# Patient Record
Sex: Female | Born: 1960
Health system: Southern US, Community
[De-identification: ages and names within clinical notes are randomized; demographics above are authoritative.]

## PROBLEM LIST (undated history)

## (undated) DIAGNOSIS — S62629A Displaced fracture of medial phalanx of unspecified finger, initial encounter for closed fracture: Secondary | ICD-10-CM

## (undated) HISTORY — PX: FINGER SURGERY: SHX640

## (undated) HISTORY — DX: Displaced fracture of middle phalanx of unspecified finger, initial encounter for closed fracture: S62.629A

---

## 2006-12-02 ENCOUNTER — Emergency Department (HOSPITAL_COMMUNITY): Admission: EM | Admit: 2006-12-02 | Discharge: 2006-12-02 | Payer: Self-pay | Admitting: Emergency Medicine

## 2006-12-03 ENCOUNTER — Ambulatory Visit: Payer: Self-pay | Admitting: Vascular Surgery

## 2006-12-03 ENCOUNTER — Ambulatory Visit (HOSPITAL_COMMUNITY): Admission: RE | Admit: 2006-12-03 | Discharge: 2006-12-03 | Payer: Self-pay | Admitting: Emergency Medicine

## 2006-12-04 ENCOUNTER — Ambulatory Visit: Payer: Self-pay | Admitting: Family Medicine

## 2006-12-18 ENCOUNTER — Ambulatory Visit: Payer: Self-pay | Admitting: Vascular Surgery

## 2007-03-14 ENCOUNTER — Encounter: Payer: Self-pay | Admitting: Family Medicine

## 2007-05-06 ENCOUNTER — Ambulatory Visit: Payer: Self-pay | Admitting: Family Medicine

## 2009-08-11 ENCOUNTER — Ambulatory Visit: Payer: Self-pay | Admitting: Family Medicine

## 2009-08-11 ENCOUNTER — Other Ambulatory Visit: Admission: RE | Admit: 2009-08-11 | Discharge: 2009-08-11 | Payer: Self-pay | Admitting: Family Medicine

## 2009-08-11 DIAGNOSIS — R109 Unspecified abdominal pain: Secondary | ICD-10-CM | POA: Insufficient documentation

## 2009-08-11 DIAGNOSIS — F17200 Nicotine dependence, unspecified, uncomplicated: Secondary | ICD-10-CM | POA: Insufficient documentation

## 2009-08-11 DIAGNOSIS — K5909 Other constipation: Secondary | ICD-10-CM | POA: Insufficient documentation

## 2009-08-11 DIAGNOSIS — N760 Acute vaginitis: Secondary | ICD-10-CM | POA: Insufficient documentation

## 2009-08-12 ENCOUNTER — Encounter: Payer: Self-pay | Admitting: Physician Assistant

## 2009-08-12 LAB — CONVERTED CEMR LAB: Gardnerella vaginalis: POSITIVE — AB

## 2009-08-16 ENCOUNTER — Encounter: Payer: Self-pay | Admitting: Physician Assistant

## 2009-08-17 ENCOUNTER — Encounter: Payer: Self-pay | Admitting: Physician Assistant

## 2009-08-17 LAB — CONVERTED CEMR LAB
ALT: 15 units/L (ref 0–35)
AST: 20 units/L (ref 0–37)
Albumin: 4.4 g/dL (ref 3.5–5.2)
Alkaline Phosphatase: 47 units/L (ref 39–117)
CO2: 24 meq/L (ref 19–32)
Calcium: 9.7 mg/dL (ref 8.4–10.5)
HCT: 33 % — ABNORMAL LOW (ref 36.0–46.0)
HDL: 60 mg/dL (ref >39)
Hemoglobin: 11.6 g/dL — ABNORMAL LOW (ref 12.0–15.0)
LDL Cholesterol: 108 mg/dL — ABNORMAL HIGH (ref 0–99)
Potassium: 4.3 meq/L (ref 3.5–5.3)
RDW: 13.3 % (ref 11.5–15.5)
Retic Ct Pct: 0.9 % (ref 0.4–3.1)
TSH: 0.48 microintl units/mL (ref 0.350–4.500)
Total Protein: 6.7 g/dL (ref 6.0–8.3)
Triglycerides: 86 mg/dL (ref <150)
Vit D, 25-Hydroxy: 18 ng/mL — ABNORMAL LOW (ref 30–89)
WBC: 3.9 10*3/uL — ABNORMAL LOW (ref 4.0–10.5)

## 2009-08-18 ENCOUNTER — Encounter: Payer: Self-pay | Admitting: Physician Assistant

## 2009-08-18 ENCOUNTER — Ambulatory Visit: Payer: Self-pay | Admitting: Family Medicine

## 2009-08-18 LAB — CONVERTED CEMR LAB: Chlamydia, Swab/Urine, PCR: NEGATIVE

## 2009-08-19 ENCOUNTER — Telehealth: Payer: Self-pay | Admitting: Family Medicine

## 2009-08-20 ENCOUNTER — Encounter: Payer: Self-pay | Admitting: Family Medicine

## 2009-08-25 ENCOUNTER — Telehealth: Payer: Self-pay | Admitting: Family Medicine

## 2009-09-01 ENCOUNTER — Ambulatory Visit: Payer: Self-pay | Admitting: Family Medicine

## 2009-09-01 DIAGNOSIS — A5901 Trichomonal vulvovaginitis: Secondary | ICD-10-CM | POA: Insufficient documentation

## 2009-09-02 ENCOUNTER — Telehealth: Payer: Self-pay | Admitting: Physician Assistant

## 2009-09-02 ENCOUNTER — Encounter: Payer: Self-pay | Admitting: Physician Assistant

## 2009-09-02 LAB — CONVERTED CEMR LAB: Candida species: NEGATIVE

## 2009-12-31 ENCOUNTER — Ambulatory Visit: Payer: Self-pay | Admitting: Family Medicine

## 2009-12-31 DIAGNOSIS — M7552 Bursitis of left shoulder: Secondary | ICD-10-CM | POA: Insufficient documentation

## 2010-04-03 ENCOUNTER — Encounter: Payer: Self-pay | Admitting: Family Medicine

## 2010-04-12 NOTE — Letter (Signed)
Summary: consults  consults   Imported By: Curtis Sites 10/07/2009 13:15:30  _____________________________________________________________________  External Attachment:    Type:   Image     Comment:   External Document

## 2010-04-12 NOTE — Letter (Signed)
Summary: progress notes  progress notes   Imported By: Curtis Sites 10/07/2009 13:20:29  _____________________________________________________________________  External Attachment:    Type:   Image     Comment:   External Document

## 2010-04-12 NOTE — Letter (Signed)
Summary: phone notes  phone notes   Imported By: Curtis Sites 10/07/2009 13:19:56  _____________________________________________________________________  External Attachment:    Type:   Image     Comment:   External Document

## 2010-04-12 NOTE — Letter (Signed)
Summary: lab  lab   Imported By: Curtis Sites 10/07/2009 13:16:44  _____________________________________________________________________  External Attachment:    Type:   Image     Comment:   External Document

## 2010-04-12 NOTE — Letter (Signed)
Summary: GYNECCOLOGIC CYTOLOGY  GYNECCOLOGIC CYTOLOGY   Imported By: Lind Guest 09/08/2009 14:25:41  _____________________________________________________________________  External Attachment:    Type:   Image     Comment:   External Document

## 2010-04-12 NOTE — Assessment & Plan Note (Signed)
Summary: physical- room 3   Vital Signs:  Patient profile:   50 year old female Weight:      171.50 pounds O2 Sat:      100 % on Room air Pulse rate:   85 / minute Resp:     16 per minute BP sitting:   104 / 68  (left arm)  Vitals Entered By: Adella Hare LPN (August 12, 5619 9:28 AM) Marland Kitchen  CC: physical Is Patient Diabetic? No Pain Assessment Patient in pain? no        CC:  physical.  History of Present Illness: Pt is here today for her annual PE/Pap.  Menses usually once a mos.  Had menses May 10-14th, nl time/expected.  Then after 1 week had another menses.  This is unusual for her. Using condoms for birth control.  Little vag dischg with odor. No new partners. +SBE Last mamm approx 2009. +Dentist + Eye Dr, last eye exam May 2011. Tetnus utd.  Hx of constipation.  Uses Dulcolax pills twice a week to have BM x approx 1 yr.   Current Medications (verified): 1)  None  Allergies (verified): No Known Drug Allergies  Past History:  Past medical, surgical, family and social histories (including risk factors) reviewed for relevance to current acute and chronic problems.  Past Medical History: Unremarkable  Past Surgical History: Right hand surgery childhood  Family History: Reviewed history and no changes required. mother living- DM father living- HTN Two sisters living- presumed healthy Two brothers deceased- mva, sids  Social History: Reviewed history and no changes required. Employed fulltime [employer]or, [employer] Divorced One grown child Current Smoker a half pack a day Alcohol use- socially Drug use-no Regular exercise-yes Smoking Status:  current Drug Use:  no Does Patient Exercise:  yes  Review of Systems General:  Denies chills and fever. ENT:  Denies earache, nasal congestion, and sore throat. CV:  Denies chest pain or discomfort and palpitations. Resp:  Denies cough and shortness of breath. GI:  Complains of constipation; denies  indigestion, nausea, and vomiting. GU:  Complains of abnormal vaginal bleeding and discharge; denies dysuria, incontinence, and urinary frequency. Neuro:  Denies headaches, numbness, and tingling. Psych:  Denies anxiety and depression. Allergy:  Denies seasonal allergies and sneezing.  Physical Exam  General:  Well-developed,well-nourished,in no acute distress; alert,appropriate and cooperative throughout examination Head:  Normocephalic and atraumatic without obvious abnormalities. No apparent alopecia or balding. Eyes:  pupils equal, pupils round, pupils reactive to light, and no injection.   Ears:  External ear exam shows no significant lesions or deformities.  Otoscopic examination reveals clear canals, tympanic membranes are intact bilaterally without bulging, retraction, inflammation or discharge. Hearing is grossly normal bilaterally. Nose:  External nasal examination shows no deformity or inflammation. Nasal mucosa are pink and moist without lesions or exudates. Mouth:  Oral mucosa and oropharynx without lesions or exudates.  Teeth in good repair. Neck:  No deformities, masses, or tenderness noted.no thyromegaly.   Chest Wall:  no deformities and no mass.   Breasts:  No mass, nodules, thickening, tenderness, bulging, retraction, inflamation, nipple discharge or skin changes noted.   Lungs:  Normal respiratory effort, chest expands symmetrically. Lungs are clear to auscultation, no crackles or wheezes. Heart:  Normal rate and regular rhythm. S1 and S2 normal without gallop, murmur, click, rub or other extra sounds. Abdomen:  Bowel sounds positive,abdomen soft and non-tender without masses, organomegaly or hernias noted. Rectal:  No external abnormalities noted. Normal sphincter tone.  No rectal masses or tenderness. Genitalia:  Normal introitus for age, no external lesions, no vaginal discharge, mucosa pink and moist, no vaginal or cervical lesions, no vaginal atrophy, no friaility or  hemorrhage, normal uterus size and position, no adnexal masses .  Mild tenderness bilat adnexa.  No CMT. Neurologic:  alert & oriented X3, sensation intact to light touch, and gait normal.   Skin:  Intact without suspicious lesions or rashes Cervical Nodes:  No lymphadenopathy noted Psych:  Cognition and judgment appear intact. Alert and cooperative with normal attention span and concentration. No apparent delusions, illusions, hallucinations   Impression & Recommendations:  Problem # 1:  Preventive Health Care (ICD-V70.0) Discussed this months abn vag bleeding.  Advised pt that will need to wait & monitor.  If this occurs again may be due to perimenopause.  Problem # 2:  CONSTIPATION, CHRONIC (ICD-564.09) Assessment: New Discussed chronic laxative use.    Orders: Gastroenterology Referral (GI) T-Comprehensive Metabolic Panel [phone]) T-TSH [phone])  Her updated medication list for this problem includes:    Miralax Powd (Polyethylene glycol 3350) ..... Use 17 grams in 4-8 ounces of fluid once daily  Problem # 3:  VAGINITIS, BACTERIAL (ICD-616.10) Assessment: New  Her updated medication list for this problem includes:    Metronidazole 500 Mg Tabs (Metronidazole) .Marland Kitchen... Take 1 two times a day for 7 days  Orders: T-Wet Prep by [facility] [phone])  Problem # 4:  NICOTINE ADDICTION (ICD-305.1) Assessment: Comment Only  Encouraged smoking cessation and discussed different methods for smoking cessation.  Pt declines prescription.   Complete Medication List: 1)  Metronidazole 500 Mg Tabs (Metronidazole) .... Take 1 two times a day for 7 days 2)  Miralax Powd (Polyethylene glycol 3350) .... Use 17 grams in 4-8 ounces of fluid once daily  Other Orders: T-Lipid Profile ([id number]) T-CBC No Diff ([id number]) T-Vitamin D (25-Hydroxy) (02725-36644) Pap Smear (03474) Mammogram (Screening) (Mammo) Pelvic Ultrasound (Sono Pelvis)  Patient Instructions: 1)   Please schedule a follow-up appointment in 2 months. 2)  Tobacco is very bad for your health and your loved ones! You Should stop smoking!. 3)  Stop Smoking Tips: Choose a Quit date. Cut down before the Quit date. decide what you will do as a substitute when you feel the urge to smoke(gum,toothpick,exercise). 4)  I have ordered a mammogram, a pelvic ultrasound and a referral for colonoscopy. 5)  I have prescribed an antibiotic for your vaginal infection. 6)  I have prescribed Miralax for you to use once daily to help with your contstipation.  You should stop using Dulcolax once you have been on the Miralax for 3 days. Prescriptions: MIRALAX  POWD (POLYETHYLENE GLYCOL 3350) use 17 grams in 4-8 ounces of fluid once daily  #1 Lg bottle x 1   Entered and Authorized by:   Esperanza Sheets PA   Signed by:   Esperanza Sheets PA on 08/11/2009   Method used:   Electron[facility] to        [facility]  [address] [address] [id number]       [address]       [place], [place]  [address]       Ph: [phone] or [phone]       Fax: [phone]   RxID:   [id number] METRONIDAZOLE 500 MG TABS (METRONIDAZOLE) take 1 two times a day for 7 days  #14 x 0   Entered and Authorized by:   Alvis Lemmings  Garnette Czech PA   Signed by:   Esperanza Sheets PA on 08/11/2009   Method used:   [facility]        [facility]  [address] [address] [id number]       [address]       [place], [place]  [address]       Ph: [phone] or [phone]       Fax: [phone]   RxID:   [id number]    Immunization History:  Tetanus/Td Immunization History:    Tetanus/Td:  historical (05/06/2007)

## 2010-04-12 NOTE — Assessment & Plan Note (Signed)
Summary: pelvic exam- room 2   Vital Signs:  Patient profile:   50 year old female Height:      67 inches Weight:      170.50 pounds BMI:     26.80 O2 Sat:      100 % on Room air Pulse rate:   74 / minute Resp:     16 per minute BP sitting:   98 / 58  (left arm)  Vitals Entered By: Adella Hare LPN (September 01, 2009 11:12 AM) CC: follow up Is Patient Diabetic? No Pain Assessment Patient in pain? no      Comments did not bring meds to ov   CC:  follow up.  History of Present Illness: Pt presents today for f/u vag cxs for her recent trichamonas infection.  She took her antibiotics as prescribed.  Her partner has gone to a health clinic in [place] but has not recd antibiotic prescription yet to she has not been sexually active with him.  She is aware that she should not be sexually active with him again until he receives and completes treatment.  Pt states her vag dischg has resolved.  No itching or irritation.  At pts request again reviewed her recent lab, mamm and pelvic ultrasound results.  She needs to have blood drawn for the additional STD testing that she had requested due to insufficient sample at the lab and will do so today.  Her pap results are also not available yet.  Allergies (verified): No Known Drug Allergies  Review of Systems GU:  Denies discharge, dysuria, and urinary frequency.  Physical Exam  General:  Well-developed,well-nourished,in no acute distress; alert,appropriate and cooperative throughout examination Head:  Normocephalic and atraumatic without obvious abnormalities. No apparent alopecia or balding. Genitalia:  Normal introitus for age, no external lesions, no vaginal discharge, mucosa pink and moist, no vaginal or cervical lesions, no vaginal atrophy, no friaility or hemorrhage, normal uterus size and position, no adnexal masses or tenderness Skin:  Intact without suspicious lesions or rashes Psych:  Cognition and judgment appear intact. Alert and  cooperative with normal attention span and concentration. No apparent delusions, illusions, hallucinations   Impression & Recommendations:  Problem # 1:  TRICHOMONAL VAGINITIS (ICD-131.01) Assessment Improved Repeat cx done.   All of pts questions answered today regarding her recent infection. Pt was given copies of her lab orders for HIV, RPR and HSV to have drawn today.  Complete Medication List: 1)  Miralax Powd (Polyethylene glycol 3350) .... Use 17 grams in 4-8 ounces of fluid once daily 2)  Vitamin D (ergocalciferol) 50000 Unit Caps (Ergocalciferol) .... One capsule once weekly  Other Orders: HSV 2- [facility] ([id number])  Patient Instructions: 1)  Keep your next appt. 2)  I will notify you of your test results as soon as available. 3)  Call if you have any other questions. 4)  Hope you have a great summer!  Appended Document: pelvic exam- room 2

## 2010-04-12 NOTE — Letter (Signed)
Summary: history and physical  history and physical   Imported By: Curtis Sites 10/07/2009 13:16:06  _____________________________________________________________________  External Attachment:    Type:   Image     Comment:   External Document

## 2010-04-12 NOTE — Progress Notes (Signed)
Summary: results  Phone Note Call from Patient   Summary of Call: pt would like to get results of test. [phone]  pt also wants to know if she needs more labs Initial call taken by: Rudene Anda,  August 25, 2009 3:35 PM  Follow-up for Phone Call        returned call, left message Follow-up by: Adella Hare LPN,  August 25, 2009 3:37 PM  Additional Follow-up for Phone Call Additional follow up Details #1::        patient aware Additional Follow-up by: Adella Hare LPN,  August 25, 2009 3:41 PM

## 2010-04-12 NOTE — Assessment & Plan Note (Signed)
Summary: OV   Vital Signs:  Patient profile:   50 year old female Height:      67 inches Weight:      182 pounds BMI:     28.61 O2 Sat:      97 % on Room air Pulse rate:   70 / minute Pulse rhythm:   regular Resp:     16 per minute BP sitting:   120 / 80  (left arm)  Vitals Entered By: Adella Hare LPN (December 31, 2009 8:39 AM)  Nutrition Counseling: Patient's BMI is greater than 25 and therefore counseled on weight management options.  O2 Flow:  Room air CC: right upper arm pain x 4days Is Patient Diabetic? No Pain Assessment Patient in pain? no        CC:  right upper arm pain x 4days.  History of Present Illness: 4 day h/o right upper arm pain, pt had been mowing with a push mower as well as digging with a hoe. Following this , her symptoms started. She still smokes half packdaily and isstill not commited to quitting, but open to discussion. Reports  thatshe has otherwise been doing well. Denies recent fever or chills. Denies sinus pressure, nasal congestion , ear pain or sore throat. Denies chest congestion, or cough productive of sputum. Denies chest pain, palpitations, PND, orthopnea or leg swelling. Denies abdominal pain, nausea, vomitting, diarrhea or constipation. Denies change in bowel movements or bloody stool. Denies dysuria , frequency, incontinence or hesitancy. Denies  joint pain, swelling, or reduced mobility. Denies headaches, vertigo, seizures. Denies depression, anxiety or insomnia. Denies  rash, lesions, or itch.      Current Medications (verified): 1)  Vitamin D (Ergocalciferol) 50000 Unit Caps (Ergocalciferol) .... One Capsule Once Weekly  Allergies (verified): No Known Drug Allergies  Review of Systems      See HPI Eyes:  Denies blurring and discharge. MS:  Complains of joint pain and stiffness; right shoulder pain with reduced mobility for 5 days. Psych:  Denies anxiety and depression. Endo:  Denies excessive thirst and heat  intolerance. Heme:  Denies abnormal bruising and bleeding. Allergy:  Denies hives or rash and itching eyes.  Physical Exam  General:  Well-developed,well-nourished,in no acute distress; alert,appropriate and cooperative throughout examination HEENT: No facial asymmetry,  EOMI, No sinus tenderness, TM's Clear, oropharynx  pink and moist.   Chest: Clear to auscultation bilaterally.  CVS: S1, S2, No murmurs, No S3.   Abd: Soft, Nontender.  MS: Adequate ROM spine, hips, and knees. Decreased rOM rightshoulder with swelling and tenderness Ext: No edema.   CNS: CN 2-12 intact, power tone and sensation normal throughout.   Skin: Intact, no visible lesions or rashes.  Psych: Good eye contact, normal affect.  Memory intact, not anxious or depressed appearing.    Impression & Recommendations:  Problem # 1:  BURSITIS, RIGHT SHOULDER (ICD-726.10) Assessment Comment Only  Orders: Depo- Medrol 80mg  (J1040) Ketorolac-Toradol 15mg  (X5170) Admin of Therapeutic Inj  intramuscular or subcutaneous (01749)  Problem # 2:  NICOTINE ADDICTION (ICD-305.1) Assessment: Unchanged  Encouraged smoking cessation and discussed different methods for smoking cessation.   Problem # 3:  CONSTIPATION, CHRONIC (ICD-564.09) Assessment: Unchanged  The following medications were removed from the medication list:    Miralax Powd (Polyethylene glycol 3350) ..... Use 17 grams in 4-8 ounces of fluid once daily Patient advised to ensure adequate intake of fruit and vegetables daily, at least 3 servings of each, as well as water intake of at  least 48 ounces daily, adregular exercise. OTC stool softeners are helpful for daily use , up to 4 daily eg colace. Fiber intake daily is needed, in the form of Bran, Shredded Wheat or senekot.  Complete Medication List: 1)  Multivitamin  .... One daily 2)  Calcium/vitamin D  .... 1200mg /1000iiu one capsule daily 3)  Ibuprofen 800 Mg Tabs (Ibuprofen) .... Take 1 tablet by mouth  three times a day 4)  Prednisone (pak) 5 Mg Tabs (Prednisone) .... Use as directed 5)  Vitamin D (ergocalciferol) 50000 Unit Caps (Ergocalciferol) .... One capsule once weekly  Other Orders: T-Vitamin D (25-Hydroxy) ([id number])  Patient Instructions: 1)  Please schedule a follow-up appointment in 4 months. 2)  It is important that you exercise regularly at least 30 minutes 5 times a week. If you develop chest pain, have severe difficulty breathing, or feel very tired , stop exercising immediately and seek medical attention. 3)  You need to lose weight. Consider a lower calorie diet and regular exercise.  4)  you are being treated for bursitis of the right shoulder. pLS do not over exert your shoulder like this again. 5)  pLS call back on monday if no better, you will be referred to ortho at that time. 6)  Injection s today in the office, and meds are also sent in Prescriptions: VITAMIN D (ERGOCALCIFEROL) 50000 UNIT CAPS (ERGOCALCIFEROL) one capsule once weekly  #4 x 3   Entered and Authorized by:   Syliva Overman MD   Signed by:   Syliva Overman MD on 12/31/2009   Method used:   [facility]        [facility] [address] [address] [id number]       [address]       [place], [place]  [address]       Ph: [phone] or [phone]       Fax: [phone]   RxID:   [id number] PREDNISONE (PAK) 5 MG TABS (PREDNISONE) Use as directed  #21 x 0   Entered and Authorized by:   Syliva Overman MD   Signed by:   Syliva Overman MD on 12/31/2009   Method used:   [facility]        [facility] [address] [address] [id number]       [address]       [place], [place]  [address]       Ph: [phone] or [phone]       Fax: [phone]   RxID:   [id number] IBUPROFEN 800 MG TABS (IBUPROFEN) Take 1 tablet by mouth three times a day  #21 x 0   Entered and Authorized by:   Syliva Overman MD   Signed by:   Syliva Overman MD on  12/31/2009   Method used:   [facility]        [facility] [address] [id number]       [address]       [place], [place]  [address]       Ph: [phone] or [phone]       Fax: [phone]   RxID:   [id number]    Medication Administration  Injection # 1:    Medication: Depo- Medrol 80mg     Diagnosis: BURSITIS, RIGHT SHOULDER (ICD-726.10)    Route: IM  Site: RUOQ gluteus    Exp Date: 06/12    Lot #: [id number]    Mfr: Pharmacia    Patient tolerated injection without complications    Given by: Adella Hare LPN (December 31, 2009 11:58 AM)  Injection # 2:    Medication: Ketorolac-Toradol 15mg     Diagnosis: BURSITIS, RIGHT SHOULDER (ICD-726.10)    Route: IM    Site: LUOQ gluteus    Exp Date: 01/12/2011    Lot #: [id number]    Mfr: [email or web address]    Comments: toradol 60mg  given    Patient tolerated injection without complications    Given by: Adella Hare LPN (December 31, 2009 11:59 AM)  Orders Added: 1)  Est. Patient Level IV [[address]] 2)  T-Vitamin D (25-Hydroxy) [phone] 3)  Depo- Medrol 80mg  [J1040] 4)  Ketorolac-Toradol 15mg  [J1885] 5)  Admin of Therapeutic Inj  intramuscular or subcutaneous [[address]372]     Medication Administration  Injection # 1:    Medication: Depo- Medrol 80mg     Diagnosis: BURSITIS, RIGHT SHOULDER (ICD-726.10)    Route: IM    Site: RUOQ gluteus    Exp Date: 06/12    Lot #: [id number]    Mfr: Pharmacia    Patient tolerated injection without complications    Given by: Adella Hare LPN (December 31, 2009 11:58 AM)  Injection # 2:    Medication: Ketorolac-Toradol 15mg     Diagnosis: BURSITIS, RIGHT SHOULDER (ICD-726.10)    Route: IM    Site: LUOQ gluteus    Exp Date: 01/12/2011    Lot #: [id number]    Mfr: [email or web address]    Comments: toradol 60mg  given    Patient tolerated injection without complications    Given by: Adella Hare LPN (December 31, 2009 11:59 AM)  Orders Added: 1)  Est. Patient Level IV [69629] 2)   T-Vitamin D (25-Hydroxy) [52841-[address]40] 3)  Depo- Medrol 80mg  [J1040] 4)  Ketorolac-Toradol 15mg  [J1885] 5)  Admin of Therapeutic Inj  intramuscular or subcutaneous [[address]272]

## 2010-04-12 NOTE — Progress Notes (Signed)
Summary: lab  Phone Note Call from Patient   Summary of Call: lab called and wants you to call about a lab order   call back at [phone] opt. 2  [id number] Initial call taken by: Lind Guest,  August 19, 2009 3:52 PM  Follow-up for Phone Call        called, patient already aware she needs restick Follow-up by: Everitt Amber LPN,  August 20, 2009 8:57 AM

## 2010-04-12 NOTE — Letter (Signed)
Summary: misc.  misc.   Imported By: Curtis Sites 10/07/2009 13:19:33  _____________________________________________________________________  External Attachment:    Type:   Image     Comment:   External Document

## 2010-04-12 NOTE — Progress Notes (Signed)
  Phone Note Call from Patient   Caller: Patient Summary of Call: wants to know if you would still be willing to write script for her partner? Initial call taken by: Adella Hare LPN,  September 02, 2009 12:22 PM  Follow-up for Phone Call        He is not a pt of our practice so I cannot prescribe for him.  Sorry. Follow-up by: Esperanza Sheets PA,  September 02, 2009 12:59 PM  Additional Follow-up for Phone Call Additional follow up Details #1::        returned call, left message Additional Follow-up by: Adella Hare LPN,  September 02, 2009 3:40 PM    Additional Follow-up for Phone Call Additional follow up Details #2::    Judithe aware Follow-up by: Adella Hare LPN,  September 02, 2009 3:46 PM

## 2010-04-12 NOTE — Letter (Signed)
Summary: demographic  demographic   Imported By: Curtis Sites 10/07/2009 13:15:47  _____________________________________________________________________  External Attachment:    Type:   Image     Comment:   External Document

## 2010-04-12 NOTE — Assessment & Plan Note (Signed)
Summary: office visit   Vital Signs:  Patient profile:   50 year old female Height:      67 inches Weight:      173 pounds BMI:     27.19 O2 Sat:      97 % Pulse rate:   74 / minute Pulse rhythm:   regular Resp:     16 per minute BP sitting:   122 / 82  (left arm) Cuff size:   large  Vitals Entered By: Everitt Amber LPN (August 18, 1608 11:15 AM)  Nutrition Counseling: Patient's BMI is greater than 25 and therefore counseled on weight management options. CC: Follow up chronic problems, has been itching alot lately   CC:  Follow up chronic problems and has been itching alot lately.  History of Present Illness: Pt in today with primary concern about being recently dx with an STD, she is tearful, upset and slightly confused, this is her first TD, and she recently entered a new re;lationship. She requests testing for all possible STD's . She reports an episode of acute pelvic pain, she has already been referred for a pelvic US which she is encouraged to keep, she seemed to have "forgotten" the appt,but actuall isewxtremely anxious and tearful at the visit.   Preventive Screening-Counseling & Management  Alcohol-Tobacco     Smoking Cessation Counseling: yes  Current Medications (verified): 1)  Miralax  Powd (Polyethylene Glycol 3350) .... Use 17 Grams in 4-8 Ounces of Fluid Once Daily  Allergies (verified): No Known Drug Allergies  Review of Systems      See HPI General:  Denies chills and fever. Eyes:  Denies discharge and red eye. ENT:  Denies hoarseness, nasal congestion, and sinus pressure. CV:  Denies chest pain or discomfort, difficulty breathing while lying down, palpitations, and swelling of feet. Resp:  Denies cough, sputum productive, and wheezing. GI:  Complains of constipation; denies diarrhea, nausea, and vomiting; chronic. GU:  Complains of discharge; denies dysuria and urinary frequency. MS:  Denies joint pain and stiffness. Derm:  Denies itching and  rash. Neuro:  Denies headaches, seizures, and tingling. Psych:  Complains of anxiety and easily tearful; denies suicidal thoughts/plans, thoughts of violence, and unusual visions or sounds; acute anxiety and depression with recent STD dx.. Endo:  Denies excessive thirst and excessive urination. Heme:  Denies abnormal bruising and bleeding. Allergy:  Denies hives or rash and sneezing.  Physical Exam  General:  Well-developed,well-nourished,in no acute distress; alert,appropriate and cooperative throughout examination. tearful, anxious and scared. HEENT: No facial asymmetry,  EOMI, No sinus tenderness, TM's Clear, oropharynx  pink and moist.   Chest: Clear to auscultation bilaterally. Decreased air entry bilaterally.  CVS: S1, S2, No murmurs, No S3.   Abd: Soft, Nontender.  MS: Adequate ROM spine, hips, shoulders and knees.  Ext: No edema.   CNS: CN 2-12 intact, power tone and sensation normal throughout.   Skin: Intact, no visible lesions or rashes.  Psych: Good eye contact, normal affect.  Memory intact.    Impression & Recommendations:  Problem # 1:  VAGINITIS, BACTERIAL (ICD-616.10) Assessment Comment Only safe sex counselling done, the need for treatment of her partener was stressed, she is to have additional SYTD tsting. In house counselling doneextensively, pt distraught with thedx of an STD, her first ever, recently entered a newrelationship, has plans to abstain until restedsted., her decision  Problem # 2:  NICOTINE ADDICTION (ICD-305.1) Assessment: Unchanged  Encouraged smoking cessation and discussed different methods for smoking  cessation.   Problem # 3:  PELVIC PAIN, ACUTE (ICD-789.09) Assessment: Comment Only she is advised of the pelvic US already scheduled and encourage d to keep the appt which I believe she will, she will f/u with her provider at next ov  Complete Medication List: 1)  Miralax Powd (Polyethylene glycol 3350) .... Use 17 grams in 4-8 ounces of  fluid once daily 2)  Vitamin D (ergocalciferol) 50000 Unit Caps (Ergocalciferol) .... One capsule once weekly  Other Orders: T-Chlamydia & GC Probe, Urine ([id number])  Patient Instructions: 1)  F/u in 2 weeks for pelvic exam   with PA 2)  pls start vit D tabs 3)  Pls start MVI with iron and calcium with D one daily of each 4)  Keep appt for pelvic US. 5)  Your partener needs treatment for TRICHOMONAS 6)  Tobacco is very bad for your health and your loved ones! You Should stop smoking!. 7)  Stop Smoking Tips: Choose a Quit date. Cut down before the Quit date. decide what you will do as a substitute when you feel the urge to smoke(gum,toothpick,exercise). Prescriptions: VITAMIN D (ERGOCALCIFEROL) 50000 UNIT CAPS (ERGOCALCIFEROL) one capsule once weekly  #4 x 4   Entered and Authorized by:   Syliva Overman MD   Signed by:   Syliva Overman MD on 08/18/2009   Method used:   Electronically to        [facility]  [address] [address] [id number] (retail)       [address]       [place], [place]  [address]       Ph: [phone] or [phone]       Fax: [phone]   RxID:   [id number]

## 2010-07-26 NOTE — Consult Note (Signed)
VASCULAR SURGERY CONSULTATION   Guerrero, Laura ANN  DOB:  November 22, 1960                                       12/18/2006  ZOXWR#:[id number]   REASON FOR CONSULTATION:  I saw Laura Guerrero in the office today in  consultation concerning her left leg swelling.  She was referred by Dr.  Syliva Overman.   HISTORY OF PRESENT ILLNESS:  This is a pleasant 50 year old woman who  had been having some problems with her knee popping.  She was  evaluated by an orthopedic surgeon and they attempted to aspirate fluid  from the joint but there was really no fluid, she did have a cortisone  injection at that time.  She states that since that time she has noticed  swelling in the left leg and ankle.  She does not remember any specific  injury to the area.  She has had no previous history of surgery or  radiation therapy to the abdomen or groins.  She has had no history of  DVT or phlebitis in the past.   PAST MEDICAL HISTORY:  Her past medical history is unremarkable.  She  denies any history of diabetes, hypertension, hypercholesterolemia,  history of previous myocardial infarction, or history of congestive  heart failure.   FAMILY HISTORY:  There is no history of premature cardiovascular  disease.   SOCIAL HISTORY:  She is single, she has one child.  She smokes a half a  pack per day and has been smoking for 30 years.   REVIEW OF SYSTEMS:  The review of systems is documented on the medical  history form in her chart.  Of note, she is on no medications.   PHYSICAL EXAMINATION:  On physical examination blood pressure is 110/69,  heart rate is 69.  I did not detect any carotid bruits.  The lungs are  clear bilaterally to auscultation.  On cardiac exam she has a regular  rate and rhythm.  The abdomen is soft and non-tender.  I cannot  appreciate any masses.  She has palpable femoral and pedal pulses  bilaterally.  Both feet are warm and well-perfused.  She has some mild  non-pitting  edema on the left.  I can only get minimal difference in  size comparing the two legs.   ASSESSMENT:  I explained to her that I think she does have some mild  lymphedema on the left.  She has had a venous duplex scan on December 03, 2006 which showed no evidence of deep venous thrombosis.  We have  discussed the treatment for lymphedema and that being elevation and  compression therapy.  I did ask her also to follow-up with her  orthopedic surgeon to see if there is anyway this could be related to  the cortisone injection.  I did not see any evidence of ecchymosis  however, to suggest that this swelling is related to any type of  bleeding.  I will be happy to see her back if any new vascular problems  arise.   Di Kindle. Edilia Bo, M.D.  Electronically Signed  CSD/MEDQ  D:  12/18/2006  T:  12/19/2006  Job:  [id number]

## 2010-08-26 ENCOUNTER — Encounter: Payer: Self-pay | Admitting: Family Medicine

## 2010-09-01 ENCOUNTER — Other Ambulatory Visit (HOSPITAL_COMMUNITY)
Admission: RE | Admit: 2010-09-01 | Discharge: 2010-09-01 | Disposition: A | Discharge status: Home / Self Care | Payer: BC Managed Care – PPO | Source: Ambulatory Visit | Attending: Family Medicine | Admitting: Family Medicine

## 2010-09-01 ENCOUNTER — Encounter: Payer: Self-pay | Admitting: Family Medicine

## 2010-09-01 ENCOUNTER — Ambulatory Visit (INDEPENDENT_AMBULATORY_CARE_PROVIDER_SITE_OTHER): Payer: BC Managed Care – PPO | Admitting: Family Medicine

## 2010-09-01 VITALS — BP 110/64 | HR 81 | Resp 16 | Ht 66.5 in | Wt 160.1 lb

## 2010-09-01 DIAGNOSIS — Z124 Encounter for screening for malignant neoplasm of cervix: Secondary | ICD-10-CM

## 2010-09-01 DIAGNOSIS — M67919 Unspecified disorder of synovium and tendon, unspecified shoulder: Secondary | ICD-10-CM

## 2010-09-01 DIAGNOSIS — R5383 Other fatigue: Secondary | ICD-10-CM

## 2010-09-01 DIAGNOSIS — Z1211 Encounter for screening for malignant neoplasm of colon: Secondary | ICD-10-CM

## 2010-09-01 DIAGNOSIS — Z0000 Encounter for general adult medical examination without abnormal findings: Secondary | ICD-10-CM

## 2010-09-01 DIAGNOSIS — M719 Bursopathy, unspecified: Secondary | ICD-10-CM

## 2010-09-01 DIAGNOSIS — F17200 Nicotine dependence, unspecified, uncomplicated: Secondary | ICD-10-CM

## 2010-09-01 DIAGNOSIS — R5381 Other malaise: Secondary | ICD-10-CM

## 2010-09-01 DIAGNOSIS — Z01419 Encounter for gynecological examination (general) (routine) without abnormal findings: Secondary | ICD-10-CM | POA: Insufficient documentation

## 2010-09-01 DIAGNOSIS — Z139 Encounter for screening, unspecified: Secondary | ICD-10-CM

## 2010-09-01 DIAGNOSIS — Z13220 Encounter for screening for lipoid disorders: Secondary | ICD-10-CM

## 2010-09-01 LAB — POC HEMOCCULT BLD/STL (OFFICE/1-CARD/DIAGNOSTIC): Fecal Occult Blood, POC: NEGATIVE

## 2010-09-01 MED ORDER — METHYLPREDNISOLONE ACETATE 80 MG/ML IJ SUSP
80.0000 mg | Freq: Once | INTRAMUSCULAR | Status: AC
  Administered 2010-09-01: 80 mg via INTRAMUSCULAR

## 2010-09-01 NOTE — Patient Instructions (Addendum)
cPE in 12 months.  Fasting labs asap.  pls schedule your mammogram and have them send it to me.  CONGRATS on healthy lifestyle changes resulting in weight loss.   Please think about quitting smoking.  This is very important for your health.  Consider setting a quit date, then cutting back or switching brands to prepare to stop.  Also think of the money you will save every day by not smoking.  Quick Tips to Quit Smoking: Fix a date i.e. keep a date in mind from when you would not touch a tobacco product to smoke  Keep yourself busy and block your mind with work loads or reading books or watching movies in malls where smoking is not allowed  Vanish off the things which reminds you about smoking for example match box, or your favorite lighter, or the pipe you used for smoking, or your favorite jeans and shirt with which you used to enjoy smoking, or the club where you used to do smoking  Try to avoid certain people places and incidences where and with whom smoking is a common factor to add on  Praise yourself with some token gifts from the money you saved by stopping smoking  Anti Smoking teams are there to help you. Join their programs  Anti-smoking Gums are there in many medical shops. Try them to quit smoking   Side-effects of Smoking: Disease caused by smoking cigarettes are emphysema, bronchitis, heart failures  Premature death  Cancer is the major side effect of smoking  Heart attacks and strokes are the quick effects of smoking causing sudden death  Some smokers lives end up with limbs amputated  Breathing problem or fast breathing is another side effect of smoking  Due to more intakes of smokes, carbon mono-oxide goes into your brain and other muscles of the body which leads to swelling of the veins and blockage to the air passage to lungs  Carbon monoxide blocks blood vessels which leads to blockage in the flow of blood to different major body organs like heart lungs and thus leads  to attacks and deaths  During pregnancy smoking is very harmful and leads to premature birth of the infant, spontaneous abortions, low weight of the infant during birth  Fat depositions to narrow and blocked blood vessels causing heart attacks  In many cases cigarette smoking caused infertility in men

## 2010-09-04 ENCOUNTER — Encounter: Payer: Self-pay | Admitting: Family Medicine

## 2010-09-04 NOTE — Progress Notes (Signed)
  Subjective:    Patient ID: Laura Guerrero, female    DOB: Jan 06, 1961, 50 y.o.   MRN: [id number]  HPI The PT is here for annual exam.  Preventive health is updated, specifically  Cancer screening, and Immunization.   Questions or concerns regarding consultations or procedures which the PT has had in the interim are  addressed. The PT denies any adverse reactions to current medications since the last visit.  There are no new concerns. 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 She currently smokes about 7 ciggs daily and is unwilling to commiting to quit at this time There are no specific complaints. Pt currently refusing colonscopy       Review of Systems Denies recent fever or chills. Denies sinus pressure, nasal congestion, ear pain or sore throat. Denies chest congestion, productive cough or wheezing. Denies chest pains, palpitations, paroxysmal nocturnal dyspnea, orthopnea and leg swelling Denies abdominal pain, nausea, vomiting,diarrhea or constipation.  Denies rectal bleeding or change in bowel movement. Denies dysuria, frequency, hesitancy or incontinence. Denies joint pain, swelling and limitation in mobility. Denies headaches, seizure, numbness, or tingling. Denies depression, anxiety or insomnia. Denies skin break down or rash.        Objective:   Physical Exam Pleasant well nourished female, alert and oriented x 3, in no cardio-pulmonary distress. Afebrile. HEENT No facial trauma or asymetry. No sinus tenderness  EOMI, PERTL, fundoscopic exam is normal, no hemorhage or exudate. No papiledema External ears normal, tympanic membranes clear. Oropharynx moist, no exudate, good dentition. Neck: supple, no adenopathy,JVD or thyromegaly.No bruits.  Chest: Clear to ascultation bilaterally.No crackles or wheezes.Decreased air entry. Non tender to palpation  Breast: No asymetry,no masses. No nipple discharge or  inversion. No axillary or supraclavicular adenopathy  Cardiovascular system; Heart sounds normal,  S1 and  S2 ,no S3.  No murmur, or thrill. Apical beat not displaced Peripheral pulses normal.  Abdomen: Soft, non tender, no organomegaly or masses. No bruits. Bowel sounds normal. No guarding, tenderness or rebound.  Rectal:  No mass. guaic negative stool.  GU: External genitalia normal. No lesions. Vaginal canal normal.No discharge. Uterus normal size, no adnexal masses, no cervical motion or adnexal tenderness.  Musculoskeletal exam: Full ROM of spine, hips , shoulders and knees. No deformity ,swelling or crepitus noted. No muscle wasting or atrophy.   Neurologic: Cranial nerves 2 to 12 intact. Power, tone ,sensation and reflexes normal throughout. No disturbance in gait. No tremor.  Skin: Intact, no ulceration, erythema , scaling or rash noted. Pigmentation normal throughout  Psych; Normal mood and affect. Judgement and concentration normal        Assessment & Plan:

## 2010-09-04 NOTE — Assessment & Plan Note (Signed)
Unchanged, counselled to quit 

## 2010-09-07 ENCOUNTER — Telehealth: Payer: Self-pay | Admitting: Family Medicine

## 2010-09-07 MED ORDER — METRONIDAZOLE 500 MG PO TABS: 500.0000 mg | ORAL_TABLET | Freq: Two times a day (BID) | ORAL | Status: AC

## 2010-09-07 MED ORDER — METRONIDAZOLE 500 MG PO TABS: 500.0000 mg | ORAL_TABLET | Freq: Two times a day (BID) | ORAL | Status: DC

## 2010-09-07 NOTE — Progress Notes (Signed)
Addended by: Diamantina Monks on: 09/07/2010 09:36 AM   Modules accepted: Orders

## 2010-09-07 NOTE — Telephone Encounter (Signed)
Med resent 

## 2010-09-07 NOTE — Progress Notes (Signed)
Patient aware and med sent in for infection

## 2010-12-22 LAB — I-STAT 8, (EC8 V) (CONVERTED LAB)
Acid-Base Excess: 3 — ABNORMAL HIGH
Bicarbonate: 28.9 — ABNORMAL HIGH
HCT: 37
Operator id: 151321
TCO2: 30
pCO2, Ven: 50.9 — ABNORMAL HIGH
pH, Ven: 7.363 — ABNORMAL HIGH

## 2010-12-22 LAB — POCT I-STAT CREATININE
Creatinine, Ser: 1
Operator id: 151321

## 2010-12-22 LAB — DIFFERENTIAL
Basophils Absolute: 0
Basophils Relative: 1
Eosinophils Absolute: 0
Eosinophils Relative: 1
Monocytes Absolute: 0.4

## 2010-12-22 LAB — CBC
HCT: 34.7 — ABNORMAL LOW
Hemoglobin: 11.8 — ABNORMAL LOW
MCHC: 34.1
RDW: 13.4

## 2010-12-22 LAB — PROTIME-INR
INR: 0.9
Prothrombin Time: 12.8

## 2011-06-21 ENCOUNTER — Ambulatory Visit (INDEPENDENT_AMBULATORY_CARE_PROVIDER_SITE_OTHER): Payer: BC Managed Care – PPO | Admitting: Family Medicine

## 2011-06-21 ENCOUNTER — Encounter: Payer: Self-pay | Admitting: Family Medicine

## 2011-06-21 ENCOUNTER — Encounter: Payer: BC Managed Care – PPO | Admitting: Family Medicine

## 2011-06-21 VITALS — BP 124/82 | HR 91 | Resp 16 | Ht 66.5 in | Wt 165.1 lb

## 2011-06-21 DIAGNOSIS — Z13220 Encounter for screening for lipoid disorders: Secondary | ICD-10-CM

## 2011-06-21 DIAGNOSIS — M79602 Pain in left arm: Secondary | ICD-10-CM | POA: Insufficient documentation

## 2011-06-21 DIAGNOSIS — M79601 Pain in right arm: Secondary | ICD-10-CM

## 2011-06-21 DIAGNOSIS — F17200 Nicotine dependence, unspecified, uncomplicated: Secondary | ICD-10-CM

## 2011-06-21 DIAGNOSIS — M79609 Pain in unspecified limb: Secondary | ICD-10-CM

## 2011-06-21 DIAGNOSIS — R5383 Other fatigue: Secondary | ICD-10-CM

## 2011-06-21 DIAGNOSIS — R5381 Other malaise: Secondary | ICD-10-CM

## 2011-06-21 DIAGNOSIS — Z1211 Encounter for screening for malignant neoplasm of colon: Secondary | ICD-10-CM

## 2011-06-21 MED ORDER — KETOROLAC TROMETHAMINE 60 MG/2ML IM SOLN
60.0000 mg | Freq: Once | INTRAMUSCULAR | Status: AC
  Administered 2011-06-21: 60 mg via INTRAMUSCULAR

## 2011-06-21 MED ORDER — METHYLPREDNISOLONE ACETATE 80 MG/ML IJ SUSP
80.0000 mg | Freq: Once | INTRAMUSCULAR | Status: AC
  Administered 2011-06-21: 80 mg via INTRAMUSCULAR

## 2011-06-21 NOTE — Progress Notes (Signed)
  Subjective:    Patient ID: Laura Guerrero, female    DOB: [birth year], 51 y.o.   MRN: [id number]  HPI The PT is here for annual exam and re-evaluation of chronic medical conditions, medication management and review of any available recent lab and radiology data.  Preventive health is updated, specifically  Cancer screening and Immunization.   Questions or concerns regarding consultations or procedures which the PT has had in the interim are  addressed. The PT denies any adverse reactions to current medications since the last visit.  C/o right arm pain with reduced mobility, no aggravating factor , has had  this in the past with good response to anti inflammatories      Review of Systems See HPI Denies recent fever or chills. Denies sinus pressure, nasal congestion, ear pain or sore throat. Denies chest congestion, productive cough or wheezing. Denies chest pains, palpitations and leg swelling Denies abdominal pain, nausea, vomiting,diarrhea or constipation.   Denies dysuria, frequency, hesitancy or incontinence. Denies headaches, seizures, numbness, or tingling. Denies depression, anxiety or insomnia. Denies skin break down or rash.        Objective:   Physical Exam Pleasant well nourished female, alert and oriented x 3, in no cardio-pulmonary distress. Afebrile. HEENT No facial trauma or asymetry. Sinuses non tender.  EOMI, PERTL,  External ears normal, tympanic membranes clear. Oropharynx moist, no exudate, fair  dentition. Neck: supple, no adenopathy,JVD or thyromegaly.No bruits.  Chest: Clear to ascultation bilaterally.No crackles or wheezes. Non tender to palpation  Breast: No asymetry,no masses. No nipple discharge or inversion. No axillary or supraclavicular adenopathy  Cardiovascular system; Heart sounds normal,  S1 and  S2 ,no S3.  No murmur, or thrill. Apical beat not displaced Peripheral pulses normal.  Abdomen: Soft, non tender, no organomegaly or  masses. No bruits. Bowel sounds normal. No guarding, tenderness or rebound.  Rectal:  Deferred not due curently GU: Deferred exam no due  Musculoskeletal exam: Full ROM of spine, hips ,  and knees.Reduced in right shoulder No deformity ,swelling or crepitus noted. No muscle wasting or atrophy.   Neurologic: Cranial nerves 2 to 12 intact. Power, tone ,sensation and reflexes normal throughout. No disturbance in gait. No tremor.  Skin: Intact, no ulceration, erythema , scaling or rash noted. Pigmentation normal throughout  Psych; Normal mood and affect. Judgement and concentration normal        Assessment & Plan:

## 2011-06-21 NOTE — Assessment & Plan Note (Signed)
2 month h/o right arm pain toradol 60mg  and depo medrol 80mg  Im in office

## 2011-06-21 NOTE — Patient Instructions (Signed)
Gynae exam in July.  Injections today for right arm pain.  Fasting cbc, chem 7, lipid, tsh in July.   Start 1 gel capsule of calcium with vit d one daily for bone health.   You are referred for screening colonoscopy and will get info on it.  Please try to cut back on cigarettes, 7 daily in April, 6 daily in may, 5 daily in June etc  It is important that you exercise regularly at least 30 minutes 5 times a week. If you develop chest pain, have severe difficulty breathing, or feel very tired, stop exercising immediately and seek medical attention

## 2011-06-23 ENCOUNTER — Encounter (INDEPENDENT_AMBULATORY_CARE_PROVIDER_SITE_OTHER): Payer: Self-pay | Admitting: *Deleted

## 2011-07-02 ENCOUNTER — Encounter: Payer: Self-pay | Admitting: Family Medicine

## 2011-07-02 NOTE — Assessment & Plan Note (Signed)
Unchanged counseled to quit 

## 2011-08-09 ENCOUNTER — Other Ambulatory Visit: Payer: Self-pay | Admitting: Family Medicine

## 2011-09-05 ENCOUNTER — Encounter: Payer: Self-pay | Admitting: Family Medicine

## 2011-09-05 ENCOUNTER — Encounter: Payer: BC Managed Care – PPO | Admitting: Family Medicine

## 2011-09-08 ENCOUNTER — Encounter: Payer: Self-pay | Admitting: Family Medicine

## 2011-09-13 ENCOUNTER — Encounter: Payer: Self-pay | Admitting: Family Medicine

## 2011-09-13 ENCOUNTER — Ambulatory Visit (INDEPENDENT_AMBULATORY_CARE_PROVIDER_SITE_OTHER): Payer: BC Managed Care – PPO | Admitting: Family Medicine

## 2011-09-13 ENCOUNTER — Other Ambulatory Visit (HOSPITAL_COMMUNITY)
Admission: RE | Admit: 2011-09-13 | Discharge: 2011-09-13 | Disposition: A | Discharge status: Home / Self Care | Payer: BC Managed Care – PPO | Source: Ambulatory Visit | Attending: Family Medicine | Admitting: Family Medicine

## 2011-09-13 VITALS — BP 132/70 | HR 82 | Resp 18 | Ht 66.5 in | Wt 169.0 lb

## 2011-09-13 DIAGNOSIS — Z6825 Body mass index (BMI) 25.0-25.9, adult: Secondary | ICD-10-CM

## 2011-09-13 DIAGNOSIS — F17200 Nicotine dependence, unspecified, uncomplicated: Secondary | ICD-10-CM

## 2011-09-13 DIAGNOSIS — N760 Acute vaginitis: Secondary | ICD-10-CM

## 2011-09-13 DIAGNOSIS — Z01419 Encounter for gynecological examination (general) (routine) without abnormal findings: Secondary | ICD-10-CM | POA: Insufficient documentation

## 2011-09-13 DIAGNOSIS — Z0000 Encounter for general adult medical examination without abnormal findings: Secondary | ICD-10-CM

## 2011-09-13 DIAGNOSIS — Z1211 Encounter for screening for malignant neoplasm of colon: Secondary | ICD-10-CM

## 2011-09-13 DIAGNOSIS — E663 Overweight: Secondary | ICD-10-CM | POA: Insufficient documentation

## 2011-09-13 NOTE — Assessment & Plan Note (Signed)
Swabs sent and counseled against douching

## 2011-09-13 NOTE — Assessment & Plan Note (Signed)
Deteriorated. Patient re-educated about  the importance of commitment to a  minimum of 150 minutes of exercise per week. The importance of healthy food choices with portion control discussed. Encouraged to start a food diary, count calories and to consider  joining a support group. Sample diet sheets offered. Goals set by the patient for the next several months.    

## 2011-09-13 NOTE — Patient Instructions (Addendum)
F/u in 6 month, call if you need me before  Fasting cbc, chem 7, lipid, tsh, HBa1C, vit D as soon as possible  Please sched your mammogram due.  You will be referred for a colonoscopy  Please cut back by one cigarette each month, start at 7 cigarettes daily for July, then 6in August etc.You need to quit  It is important that you exercise regularly at least 30 minutes 5 times a week. If you develop chest pain, have severe difficulty breathing, or feel very tired, stop exercising immediately and seek medical attention   A healthy diet is rich in fruit, vegetables and whole grains. Poultry fish, nuts and beans are a healthy choice for protein rather then red meat. A low sodium diet and drinking 64 ounces of water daily is generally recommended. Oils and sweet should be limited. Carbohydrates especially for those who are diabetic or overweight, should be limited to 30-45 gram per meal. It is important to eat on a regular schedule, at least 3 times daily. Snacks should be primarily fruits, vegetables or nuts.  Weight loss goal of 1.5 pounds per month  You will get info on hot flashes and how to handle them naturally.  You will get info on bv, not douching reduces the occurrence  Please think about quitting smoking.  This is very important for your health.  Consider setting a quit date, then cutting back or switching brands to prepare to stop.  Also think of the money you will save every day by not smoking.  Quick Tips to Quit Smoking: Fix a date i.e. keep a date in mind from when you would not touch a tobacco product to smoke  Keep yourself busy and block your mind with work loads or reading books or watching movies in malls where smoking is not allowed  Vanish off the things which reminds you about smoking for example match box, or your favorite lighter, or the pipe you used for smoking, or your favorite jeans and shirt with which you used to enjoy smoking, or the club where you used to do  smoking  Try to avoid certain people places and incidences where and with whom smoking is a common factor to add on  Praise yourself with some token gifts from the money you saved by stopping smoking  Anti Smoking teams are there to help you. Join their programs  Anti-smoking Gums are there in many medical shops. Try them to quit smoking   Side-effects of Smoking: Disease caused by smoking cigarettes are emphysema, bronchitis, heart failures  Premature death  Cancer is the major side effect of smoking  Heart attacks and strokes are the quick effects of smoking causing sudden death  Some smokers lives end up with limbs amputated  Breathing problem or fast breathing is another side effect of smoking  Due to more intakes of smokes, carbon mono-oxide goes into your brain and other muscles of the body which leads to swelling of the veins and blockage to the air passage to lungs  Carbon monoxide blocks blood vessels which leads to blockage in the flow of blood to different major body organs like heart lungs and thus leads to attacks and deaths  During pregnancy smoking is very harmful and leads to premature birth of the infant, spontaneous abortions, low weight of the infant during birth  Fat depositions to narrow and blocked blood vessels causing heart attacks  In many cases cigarette smoking caused infertility in men

## 2011-09-13 NOTE — Assessment & Plan Note (Signed)
Cessation counseling done 

## 2011-09-13 NOTE — Assessment & Plan Note (Signed)
Pt re educated re healthy lifestyle as far as diet and exercise. Nicotine cessation counseling done , and weight loss goal set for 6 month of 6 to 8 pounds

## 2011-09-13 NOTE — Progress Notes (Signed)
  Subjective:    Patient ID: Laura Guerrero, female    DOB: September 26, 1960, 51 y.o.   MRN: [id number]  HPI The PT is here for annual exam and re-evaluation of chronic medical conditions, medication management and review of any available recent lab and radiology data.  Preventive health is updated, specifically  Cancer screening and Immunization.   Questions or concerns regarding consultations or procedures which the PT has had in the interim are  Addressed.Recently saw derm re malodorous under arm odor, has been prescribed topical cleocin once to twice daily and cetaphil with the option to add drysol at bedtime if needed. Has also had eye and dental exams. Needs colonoscopy and will check on mammogram Smoking 7 ciggs daily, willing to attempt to quit. Will work on increased exercise and weight loss The PT denies any adverse reactions to current medications since the last visit.  There are no new concerns.  T      Review of Systems See HPI Denies recent fever or chills. Denies sinus pressure, nasal congestion, ear pain or sore throat. Denies chest congestion, productive cough or wheezing. Denies chest pains, palpitations and leg swelling Denies abdominal pain, nausea, vomiting,diarrhea or constipation.   Denies dysuria, frequency, hesitancy or incontinence.c/o fishy vaginal d/c, she douches. C/o excessive hot flashes Denies joint pain, swelling and limitation in mobility. Denies headaches, seizures, numbness, or tingling. Denies depression, anxiety or insomnia. Denies skin break down or rash.        Objective:   Physical Exam Pleasant well nourished female, alert and oriented x 3, in no cardio-pulmonary distress. Afebrile. HEENT No facial trauma or asymetry. Sinuses non tender.  EOMI, PERTL, fundoscopic exam is normal, no hemorhage or exudate.  External ears normal, tympanic membranes clear. Oropharynx moist, no exudate, fair  dentition. Neck: supple, no adenopathy,JVD or  thyromegaly.No bruits.  Chest: Clear to ascultation bilaterally.No crackles or wheezes.Decrreased air entry throughout Non tender to palpation  Breast: No asymetry,no masses. No nipple discharge or inversion. No axillary or supraclavicular adenopathy  Cardiovascular system; Heart sounds normal,  S1 and  S2 ,no S3.  No murmur, or thrill. Apical beat not displaced Peripheral pulses normal.  Abdomen: Soft, non tender, no organomegaly or masses. No bruits. Bowel sounds normal. No guarding, tenderness or rebound.  Rectal:  No mass. Guaiac negative stool.  GU: External genitalia normal. No lesions. Vaginal canal normal. Fishy discharge. Uterus normal size, no adnexal masses, no cervical motion or adnexal tenderness.  Musculoskeletal exam: Full ROM of spine, hips , shoulders and knees. No deformity ,swelling or crepitus noted. No muscle wasting or atrophy.   Neurologic: Cranial nerves 2 to 12 intact. Power, tone ,sensation and reflexes normal throughout. No disturbance in gait. No tremor.  Skin: Intact, no ulceration, erythema , scaling or rash noted. Pigmentation normal throughout  Psych; Normal mood and affect. Judgement and concentration normal       Assessment & Plan:

## 2011-09-14 LAB — WET PREP BY MOLECULAR PROBE
Candida species: NEGATIVE
Gardnerella vaginalis: POSITIVE — AB
Trichomonas vaginosis: NEGATIVE

## 2011-09-19 ENCOUNTER — Encounter (INDEPENDENT_AMBULATORY_CARE_PROVIDER_SITE_OTHER): Payer: Self-pay | Admitting: *Deleted

## 2012-03-08 ENCOUNTER — Ambulatory Visit (HOSPITAL_COMMUNITY)
Admission: RE | Admit: 2012-03-08 | Discharge: 2012-03-08 | Disposition: A | Discharge status: Home / Self Care | Payer: BC Managed Care – PPO | Source: Ambulatory Visit | Attending: Family Medicine | Admitting: Family Medicine

## 2012-03-08 ENCOUNTER — Encounter: Payer: Self-pay | Admitting: Family Medicine

## 2012-03-08 ENCOUNTER — Ambulatory Visit (INDEPENDENT_AMBULATORY_CARE_PROVIDER_SITE_OTHER): Payer: BC Managed Care – PPO | Admitting: Family Medicine

## 2012-03-08 VITALS — BP 120/90 | HR 82 | Resp 16 | Ht 66.5 in | Wt 167.0 lb

## 2012-03-08 DIAGNOSIS — R05 Cough: Secondary | ICD-10-CM | POA: Insufficient documentation

## 2012-03-08 DIAGNOSIS — Z6825 Body mass index (BMI) 25.0-25.9, adult: Secondary | ICD-10-CM

## 2012-03-08 DIAGNOSIS — R5383 Other fatigue: Secondary | ICD-10-CM

## 2012-03-08 DIAGNOSIS — R0989 Other specified symptoms and signs involving the circulatory and respiratory systems: Secondary | ICD-10-CM | POA: Insufficient documentation

## 2012-03-08 DIAGNOSIS — R5381 Other malaise: Secondary | ICD-10-CM

## 2012-03-08 DIAGNOSIS — J209 Acute bronchitis, unspecified: Secondary | ICD-10-CM

## 2012-03-08 DIAGNOSIS — J0110 Acute frontal sinusitis, unspecified: Secondary | ICD-10-CM

## 2012-03-08 DIAGNOSIS — E663 Overweight: Secondary | ICD-10-CM

## 2012-03-08 DIAGNOSIS — R059 Cough, unspecified: Secondary | ICD-10-CM | POA: Insufficient documentation

## 2012-03-08 DIAGNOSIS — F17200 Nicotine dependence, unspecified, uncomplicated: Secondary | ICD-10-CM

## 2012-03-08 DIAGNOSIS — J40 Bronchitis, not specified as acute or chronic: Secondary | ICD-10-CM | POA: Insufficient documentation

## 2012-03-08 LAB — LIPID PANEL
HDL: 67 mg/dL (ref >39)
Total CHOL/HDL Ratio: 3.5 Ratio
Triglycerides: 85 mg/dL (ref <150)

## 2012-03-08 LAB — BASIC METABOLIC PANEL WITH GFR
BUN: 18 mg/dL (ref 6–23)
CO2: 29 meq/L (ref 19–32)
Calcium: 10.3 mg/dL (ref 8.4–10.5)
Chloride: 102 meq/L (ref 96–112)
Creat: 0.72 mg/dL (ref 0.50–1.10)
Glucose, Bld: 90 mg/dL (ref 70–99)
Potassium: 4.9 meq/L (ref 3.5–5.3)
Sodium: 141 meq/L (ref 135–145)

## 2012-03-08 LAB — TSH: TSH: 0.392 u[IU]/mL (ref 0.350–4.500)

## 2012-03-08 LAB — CBC WITH DIFFERENTIAL/PLATELET
Basophils Absolute: 0 K/uL (ref 0.0–0.1)
Basophils Relative: 1 % (ref 0–1)
Eosinophils Absolute: 0 K/uL (ref 0.0–0.7)
Eosinophils Relative: 1 % (ref 0–5)
HCT: 38.4 % (ref 36.0–46.0)
Hemoglobin: 13.5 g/dL (ref 12.0–15.0)
Lymphocytes Relative: 58 % — ABNORMAL HIGH (ref 12–46)
Lymphs Abs: 2.3 K/uL (ref 0.7–4.0)
MCH: 29.7 pg (ref 26.0–34.0)
MCHC: 35.2 g/dL (ref 30.0–36.0)
MCV: 84.6 fL (ref 78.0–100.0)
Monocytes Absolute: 0.4 K/uL (ref 0.1–1.0)
Monocytes Relative: 9 % (ref 3–12)
Neutro Abs: 1.2 K/uL — ABNORMAL LOW (ref 1.7–7.7)
Neutrophils Relative %: 31 % — ABNORMAL LOW (ref 43–77)
Platelets: 268 K/uL (ref 150–400)
RBC: 4.54 MIL/uL (ref 3.87–5.11)
RDW: 13.8 % (ref 11.5–15.5)
WBC: 3.9 K/uL — ABNORMAL LOW (ref 4.0–10.5)

## 2012-03-08 MED ORDER — BENZONATATE 100 MG PO CAPS: 100.0000 mg | ORAL_CAPSULE | Freq: Four times a day (QID) | ORAL | Status: DC | PRN

## 2012-03-08 MED ORDER — PENICILLIN V POTASSIUM 500 MG PO TABS: 500.0000 mg | ORAL_TABLET | Freq: Three times a day (TID) | ORAL | Status: DC

## 2012-03-08 NOTE — Progress Notes (Signed)
  Subjective:    Patient ID: Laura Guerrero, female    DOB: 1961-03-11, 51 y.o.   MRN: [id number]  HPI Pt in with a c/o of collapsing 2 days ago. States she had been having viral symptoms 2 days before, and had been outside  washing her car in the cold the day she nearly passed out. Describes near frostbite symptoms.Still smoking , though less. Denies incontinence of stool or urine, denies jerking episode being witnessed, denies chest tightness   Review of Systems See HPI Denies recent fever or chills. C/o sinus pressure with drainage and chest congestion with cough productive of yellow sputum, intermittent chills, no fever. Denies chest pains, palpitations and leg swelling Denies abdominal pain, nausea, vomiting,diarrhea or constipation.   Denies dysuria, frequency, hesitancy or incontinence. Denies joint pain, swelling and limitation in mobility. Denies headaches, seizures, numbness, or tingling. Denies depression, anxiety or insomnia. Denies skin break down or rash.        Objective:   Physical Exam  Patient alert and oriented and in no cardiopulmonary distress.  HEENT: No facial asymmetry, EOMI,frontal sinus tenderness,  oropharynx pink and moist.  Neck supple no adenopathy.Bruit  Chest: decreased though adequate air entry, scattered crackles, no wheezes  CVS: S1, S2 no murmurs, no S3.  ABD: Soft non tender. Bowel sounds normal.  Ext: No edema  MS: Adequate ROM spine, shoulders, hips and knees.  Skin: Intact, no ulcerations or rash noted.  Psych: Good eye contact, normal affect. Memory intact not anxious or depressed appearing.  CNS: CN 2-12 intact, power, tone and sensation normal throughout.       Assessment & Plan:

## 2012-03-08 NOTE — Patient Instructions (Addendum)
CPE July 14 or after.  You are being treated for acute bronchitis and sinusitis. Penicillin and decongestant are prescribed.  Please get a CXr.  PLEASE schedule your mammogram, past due.  You still need a colonoscopy  You need to stop smoking.  YOu are referred for a carotid US to ensure nop blockage in circulation to your brain  Labs today, tsh, cbc, chem 7, lipid.  Please do not stay out in the cold, this will make you sick

## 2012-03-10 DIAGNOSIS — J0110 Acute frontal sinusitis, unspecified: Secondary | ICD-10-CM | POA: Insufficient documentation

## 2012-03-10 NOTE — Assessment & Plan Note (Signed)
Uncontrolled,  Patient counseled for approximately 5 minutes regarding the health risks of ongoing nicotine use, specifically all types of cancer, heart disease, stroke and respiratory failure. The options available for help with cessation ,the behavioral changes to assist the process, and the option to either gradully reduce usage  Or abruptly stop.is also discussed. Pt is also encouraged to set specific goals in number of cigarettes used daily, as well as to set a quit date.

## 2012-03-10 NOTE — Assessment & Plan Note (Signed)
Recent near syncopal episode with bruit in a smoker, will obtain carotid doppler

## 2012-03-10 NOTE — Assessment & Plan Note (Addendum)
Antibiotic and decongestant prescribed, cXR obtained

## 2012-03-10 NOTE — Assessment & Plan Note (Signed)
Antibiotic course prescribed, and nasal toilet recommended

## 2012-03-10 NOTE — Assessment & Plan Note (Signed)
Improved. Pt applauded on succesful weight loss through lifestyle change, and encouraged to continue same. Weight loss goal set for the next several months.  

## 2012-03-13 DIAGNOSIS — S62629A Displaced fracture of medial phalanx of unspecified finger, initial encounter for closed fracture: Secondary | ICD-10-CM

## 2012-03-13 HISTORY — DX: Displaced fracture of middle phalanx of unspecified finger, initial encounter for closed fracture: S62.629A

## 2012-03-18 ENCOUNTER — Ambulatory Visit (HOSPITAL_COMMUNITY): Payer: BC Managed Care – PPO

## 2012-05-10 ENCOUNTER — Telehealth: Payer: Self-pay

## 2012-05-10 NOTE — Telephone Encounter (Signed)
Called and said when she fell back in Dec she jammed her right middle finger and its still bothering her. It is very sore and she thinks the nail is going to come off. Wants antibiotic. I told her she would have to be seen but she said you already knew about her finger and you could tell her what she needed to do. Please advise

## 2012-05-11 ENCOUNTER — Other Ambulatory Visit: Payer: Self-pay | Admitting: Family Medicine

## 2012-05-11 NOTE — Telephone Encounter (Signed)
lleft message with pt's mohter to ask her to have me paged in am , she is currently at work.I expect a call between 9 and 10am tomorrow

## 2012-05-12 ENCOUNTER — Other Ambulatory Visit: Payer: Self-pay | Admitting: Family Medicine

## 2012-05-12 DIAGNOSIS — M79644 Pain in right finger(s): Secondary | ICD-10-CM

## 2012-05-12 MED ORDER — CLINDAMYCIN HCL 300 MG PO CAPS: 300.0000 mg | ORAL_CAPSULE | Freq: Three times a day (TID) | ORAL | Status: DC

## 2012-05-12 NOTE — Telephone Encounter (Signed)
pls sched OV with me th week of 03/17 and let her know, I have already discussed this with her

## 2012-05-12 NOTE — Telephone Encounter (Signed)
Pt reports that in the past week right index finger has become swollen and tender aroind the nail denies fever or purulent drainage. Took acrylic nails off 2 weeks , she had them on when she had the original accident. Requests antibiotic course, did not complete the penicillin that was prescribed in Dec fully for bronchitis Nail has blue black discoloration at end of nail Pt is to take a 10 day course of cleocin which is being sent to he pharmc today. Get an xray of the right index finger no later than tomorrow.  Schedule an OV with me the week on March 17. She has also been advised that if her condition worsens she is to call in to be seen by Dr Jeanice Lim if she is available, or she is to go to urgent care for evaluation She undestands

## 2012-05-13 ENCOUNTER — Ambulatory Visit (HOSPITAL_COMMUNITY)
Admission: RE | Admit: 2012-05-13 | Discharge: 2012-05-13 | Disposition: A | Discharge status: Home / Self Care | Payer: BC Managed Care – PPO | Source: Ambulatory Visit | Attending: Family Medicine | Admitting: Family Medicine

## 2012-05-13 DIAGNOSIS — M79644 Pain in right finger(s): Secondary | ICD-10-CM

## 2012-05-14 ENCOUNTER — Telehealth: Payer: Self-pay | Admitting: Family Medicine

## 2012-05-14 ENCOUNTER — Ambulatory Visit (HOSPITAL_COMMUNITY)
Admission: RE | Admit: 2012-05-14 | Discharge: 2012-05-14 | Disposition: A | Discharge status: Home / Self Care | Payer: BC Managed Care – PPO | Source: Ambulatory Visit | Attending: Family Medicine | Admitting: Family Medicine

## 2012-05-14 ENCOUNTER — Other Ambulatory Visit: Payer: Self-pay | Admitting: Family Medicine

## 2012-05-14 DIAGNOSIS — W19XXXA Unspecified fall, initial encounter: Secondary | ICD-10-CM | POA: Insufficient documentation

## 2012-05-14 DIAGNOSIS — S62639A Displaced fracture of distal phalanx of unspecified finger, initial encounter for closed fracture: Secondary | ICD-10-CM | POA: Insufficient documentation

## 2012-05-14 DIAGNOSIS — M79644 Pain in right finger(s): Secondary | ICD-10-CM

## 2012-05-14 DIAGNOSIS — M25449 Effusion, unspecified hand: Secondary | ICD-10-CM | POA: Insufficient documentation

## 2012-05-14 DIAGNOSIS — S62609A Fracture of unspecified phalanx of unspecified finger, initial encounter for closed fracture: Secondary | ICD-10-CM

## 2012-05-14 DIAGNOSIS — M25549 Pain in joints of unspecified hand: Secondary | ICD-10-CM | POA: Insufficient documentation

## 2012-05-14 NOTE — Telephone Encounter (Signed)
Patient is aware 

## 2012-06-12 ENCOUNTER — Encounter: Payer: Self-pay | Admitting: Family Medicine

## 2012-06-12 ENCOUNTER — Ambulatory Visit (INDEPENDENT_AMBULATORY_CARE_PROVIDER_SITE_OTHER): Payer: BC Managed Care – PPO | Admitting: Family Medicine

## 2012-06-12 VITALS — BP 126/74 | HR 80 | Resp 18 | Ht 66.5 in | Wt 168.0 lb

## 2012-06-12 DIAGNOSIS — M67919 Unspecified disorder of synovium and tendon, unspecified shoulder: Secondary | ICD-10-CM

## 2012-06-12 DIAGNOSIS — M79602 Pain in left arm: Secondary | ICD-10-CM

## 2012-06-12 DIAGNOSIS — M79609 Pain in unspecified limb: Secondary | ICD-10-CM

## 2012-06-12 DIAGNOSIS — Z6825 Body mass index (BMI) 25.0-25.9, adult: Secondary | ICD-10-CM

## 2012-06-12 DIAGNOSIS — M7552 Bursitis of left shoulder: Secondary | ICD-10-CM

## 2012-06-12 DIAGNOSIS — S62629S Displaced fracture of medial phalanx of unspecified finger, sequela: Secondary | ICD-10-CM

## 2012-06-12 DIAGNOSIS — E559 Vitamin D deficiency, unspecified: Secondary | ICD-10-CM

## 2012-06-12 DIAGNOSIS — Z1211 Encounter for screening for malignant neoplasm of colon: Secondary | ICD-10-CM

## 2012-06-12 DIAGNOSIS — S42309S Unspecified fracture of shaft of humerus, unspecified arm, sequela: Secondary | ICD-10-CM

## 2012-06-12 DIAGNOSIS — M719 Bursopathy, unspecified: Secondary | ICD-10-CM

## 2012-06-12 DIAGNOSIS — F17200 Nicotine dependence, unspecified, uncomplicated: Secondary | ICD-10-CM

## 2012-06-12 DIAGNOSIS — Z13220 Encounter for screening for lipoid disorders: Secondary | ICD-10-CM

## 2012-06-12 DIAGNOSIS — E663 Overweight: Secondary | ICD-10-CM

## 2012-06-12 MED ORDER — METHYLPREDNISOLONE ACETATE 80 MG/ML IJ SUSP
80.0000 mg | Freq: Once | INTRAMUSCULAR | Status: AC
  Administered 2012-06-12: 80 mg via INTRAMUSCULAR

## 2012-06-12 MED ORDER — KETOROLAC TROMETHAMINE 60 MG/2ML IJ SOLN
60.0000 mg | Freq: Once | INTRAMUSCULAR | Status: AC
  Administered 2012-06-12: 60 mg via INTRAMUSCULAR

## 2012-06-12 NOTE — Progress Notes (Signed)
  Subjective:    Patient ID: Laura Guerrero, female    DOB: 1960-11-08, 52 y.o.   MRN: [id number]  HPI The PT is here for follow up and re-evaluation of chronic medical conditions, medication management and review of any available recent lab and radiology data.  Preventive health is updated, specifically  Cancer screening and Immunization.   Questions or concerns regarding consultations or procedures which the PT has had in the interim are  Addressed.was evaluated by ortho for fractured right mid finger, no intervention indicated, she is still concerned about deformity and swelling  The PT denies any adverse reactions to current medications since the last visit.  Cutting back slowly on nicotine unwilling to quit at this time    Left arm paiin aggaravated  By movement x 2week, has had the left arm pain in the past  Review of Systems See HPI Denies recent fever or chills. Denies sinus pressure, nasal congestion, ear pain or sore throat. Denies chest congestion, productive cough or wheezing. Denies chest pains, palpitations and leg swelling Denies abdominal pain, nausea, vomiting,diarrhea or constipation.   Denies dysuria, frequency, hesitancy or incontinence.  Denies headaches, seizures, numbness, or tingling. Denies depression, anxiety or insomnia. Denies skin break down or rash.        Objective:   Physical Exam Patient alert and oriented and in no cardiopulmonary distress.  HEENT: No facial asymmetry, EOMI, no sinus tenderness,  oropharynx pink and moist.  Neck supple no adenopathy.  Chest: Clear to auscultation bilaterally.  CVS: S1, S2 no murmurs, no S3.  ABD: Soft non tender. Bowel sounds normal.  Ext: No edema  MS: Adequate ROM spine,  hips and knees.decreased ROM left shoulder with tenderness and swelling  Deformed right 3rd finger, swelling a distal phalanx  Skin: Intact, no ulcerations or rash noted.  Psych: Good eye contact, normal affect. Memory intact not  anxious or depressed appearing.  CNS: CN 2-12 intact, power, tone and sensation normal throughout.        Assessment & Plan:

## 2012-06-12 NOTE — Patient Instructions (Addendum)
CPE after July 4  Toradol 60mg  and depo medrol 80 mg Im in office today for left arm pain, alsoprednisone for 5 days  You need to start calcium with D 1 capsule daily, 1200mg /1000Iu once daily, we will show you the recommended capsule  Vit D will be refilled   Please follow a low fat diet.   Fasting lipid, vit D in  July before visit  You are referred to Dr Karilyn Cota for screening colonoscopy

## 2012-06-13 ENCOUNTER — Encounter (INDEPENDENT_AMBULATORY_CARE_PROVIDER_SITE_OTHER): Payer: Self-pay | Admitting: *Deleted

## 2012-06-15 DIAGNOSIS — S62629A Displaced fracture of medial phalanx of unspecified finger, initial encounter for closed fracture: Secondary | ICD-10-CM | POA: Insufficient documentation

## 2012-06-15 NOTE — Assessment & Plan Note (Signed)
Decreased slightly, still unwilling to set a quit date at this time Patient counseled for approximately 5 minutes regarding the health risks of ongoing nicotine use, specifically all types of cancer, heart disease, stroke and respiratory failure. The options available for help with cessation ,the behavioral changes to assist the process, and the option to either gradully reduce usage  Or abruptly stop.is also discussed. Pt is also encouraged to set specific goals in number of cigarettes used daily, as well as to set a quit date.

## 2012-06-15 NOTE — Assessment & Plan Note (Signed)
Swelling and deformity of finger on exam non tender, has had ortho eval. Nail base discoloured due to sub ungual bleeding Advised pt tha t no intervention needed, no antibiotcs needed

## 2012-06-15 NOTE — Assessment & Plan Note (Signed)
Recurrent left shoulder and left arm pain. Toradol and depo medrol in office followed by prednisone orally for 5 days

## 2012-06-15 NOTE — Assessment & Plan Note (Signed)
Unchanged. Patient re-educated about  the importance of commitment to a  minimum of 150 minutes of exercise per week. The importance of healthy food choices with portion control discussed. Encouraged to start a food diary, count calories and to consider  joining a support group. Sample diet sheets offered. Goals set by the patient for the next several months.    

## 2012-06-15 NOTE — Assessment & Plan Note (Signed)
Anti infllamatories Im and orally

## 2012-06-26 ENCOUNTER — Other Ambulatory Visit: Payer: Self-pay

## 2012-06-26 ENCOUNTER — Telehealth: Payer: Self-pay | Admitting: Family Medicine

## 2012-06-26 MED ORDER — VITAMIN D (ERGOCALCIFEROL) 1.25 MG (50000 UNIT) PO CAPS: ORAL_CAPSULE | ORAL | Status: DC

## 2012-06-26 NOTE — Telephone Encounter (Signed)
Vit d refilled  Was prednisone suppose to be sent.

## 2012-06-27 ENCOUNTER — Other Ambulatory Visit: Payer: Self-pay | Admitting: Family Medicine

## 2012-06-27 MED ORDER — PREDNISONE (PAK) 5 MG PO TABS: 5.0000 mg | ORAL_TABLET | ORAL | Status: DC

## 2012-06-27 NOTE — Telephone Encounter (Signed)
Prednisone sent in , should have been , i am sorry, left message that med was sent on her cell, if she calls back pls let her know

## 2012-06-27 NOTE — Telephone Encounter (Signed)
Noted  

## 2012-09-24 ENCOUNTER — Ambulatory Visit: Payer: BC Managed Care – PPO | Admitting: Family Medicine

## 2012-10-23 ENCOUNTER — Encounter: Payer: Self-pay | Admitting: Family Medicine

## 2012-12-09 ENCOUNTER — Telehealth: Payer: Self-pay | Admitting: Family Medicine

## 2012-12-09 NOTE — Telephone Encounter (Signed)
Lab orders sent to [facility]

## 2012-12-10 ENCOUNTER — Other Ambulatory Visit (HOSPITAL_COMMUNITY)
Admission: RE | Admit: 2012-12-10 | Discharge: 2012-12-10 | Disposition: A | Discharge status: Home / Self Care | Payer: BC Managed Care – PPO | Source: Ambulatory Visit | Attending: Family Medicine | Admitting: Family Medicine

## 2012-12-10 ENCOUNTER — Encounter: Payer: Self-pay | Admitting: Family Medicine

## 2012-12-10 ENCOUNTER — Other Ambulatory Visit: Payer: Self-pay | Admitting: Family Medicine

## 2012-12-10 ENCOUNTER — Ambulatory Visit (INDEPENDENT_AMBULATORY_CARE_PROVIDER_SITE_OTHER): Payer: BC Managed Care – PPO | Admitting: Family Medicine

## 2012-12-10 VITALS — BP 124/88 | HR 80 | Resp 18 | Ht 66.5 in | Wt 171.8 lb

## 2012-12-10 DIAGNOSIS — Z1211 Encounter for screening for malignant neoplasm of colon: Secondary | ICD-10-CM

## 2012-12-10 DIAGNOSIS — Z113 Encounter for screening for infections with a predominantly sexual mode of transmission: Secondary | ICD-10-CM | POA: Insufficient documentation

## 2012-12-10 DIAGNOSIS — N760 Acute vaginitis: Secondary | ICD-10-CM | POA: Diagnosis not present

## 2012-12-10 DIAGNOSIS — Z1151 Encounter for screening for human papillomavirus (HPV): Secondary | ICD-10-CM | POA: Diagnosis present

## 2012-12-10 DIAGNOSIS — F17200 Nicotine dependence, unspecified, uncomplicated: Secondary | ICD-10-CM

## 2012-12-10 DIAGNOSIS — Z0000 Encounter for general adult medical examination without abnormal findings: Secondary | ICD-10-CM

## 2012-12-10 DIAGNOSIS — N951 Menopausal and female climacteric states: Secondary | ICD-10-CM | POA: Insufficient documentation

## 2012-12-10 DIAGNOSIS — Z01419 Encounter for gynecological examination (general) (routine) without abnormal findings: Secondary | ICD-10-CM | POA: Diagnosis not present

## 2012-12-10 NOTE — Patient Instructions (Addendum)
CPE in 12 month, call if you need me before  You will get information re NATURAL management of hot flashes per the [employer]. If unsuccesful with this , call for medication options   Please set a quit date to stop smoking  You are referred for a colonoscopy and will get a letter in the mail about this  Pls reconsider the flu vaccine, you need this  It is important that you exercise regularly at least 30 minutes 5 times a week. If you develop chest pain, have severe difficulty breathing, or feel very tired, stop exercising immediately and seek medical attention

## 2012-12-10 NOTE — Progress Notes (Signed)
  Subjective:    Patient ID: Laura Guerrero, female    DOB: [birth year], 52 y.o.   MRN: [id number]  HPI Pt in for annual exam C/o excessive  Hot flashes in the past 6 month wants help with this,[place] over 2 years ago Intermittent bilateral thigh pain, first right then left 4 days ago duartion 1 day each, took alleve no longer in pain, felt similar to right arm pain she has had  in the past Still smoking and unwilling to quit at this time   Review of Systems See HPI Denies recent fever or chills. Denies sinus pressure, nasal congestion, ear pain or sore throat. Denies chest congestion, productive cough or wheezing. Denies chest pains, palpitations and leg swelling Denies abdominal pain, nausea, vomiting,diarrhea or constipation.   Denies dysuria, frequency, hesitancy or incontinence.c/o  Excessive malodorous vaginal d/c wants this tested Denies joint pain, swelling and limitation in mobility. Denies headaches, seizures, numbness, or tingling. Denies depression, anxiety or insomnia. Denies skin break down or rash.        Objective:   Physical Exam  Pleasant well nourished female, alert and oriented x 3, in no cardio-pulmonary distress. Afebrile. HEENT No facial trauma or asymetry. Sinuses non tender.  EOMI, PERTL, fundoscopic exam no hemorhage or exudate.  External ears normal, tympanic membranes clear. Oropharynx moist, no exudate, fair dentition. Neck: supple, no adenopathy,JVD or thyromegaly.No bruits.  Chest: Clear to ascultation bilaterally.No crackles or wheezes. Non tender to palpation  Breast: No asymetry,no masses. No nipple discharge or inversion. No axillary or supraclavicular adenopathy  Cardiovascular system; Heart sounds normal,  S1 and  S2 ,no S3.  No murmur, or thrill. Apical beat not displaced Peripheral pulses normal.  Abdomen: Soft, non tender, no organomegaly or masses. No bruits. Bowel sounds normal. No guarding, tenderness or  rebound.  Rectal:  No mass. Guaiac negative stool.  GU: External genitalia normal. No lesions. Vaginal canal normal.malodorous creamy  discharge. Uterus normal size, no adnexal masses, no cervical motion or adnexal tenderness.  Musculoskeletal exam: Full ROM of spine, hips , shoulders and knees. No deformity ,swelling or crepitus noted. No muscle wasting or atrophy.   Neurologic: Cranial nerves 2 to 12 intact. Power, tone ,sensation and reflexes normal throughout. No disturbance in gait. No tremor.  Skin: Intact, no ulceration, erythema , scaling or rash noted. Pigmentation normal throughout  Psych; Normal mood and affect. Judgement and concentration normal       Assessment & Plan:

## 2012-12-11 ENCOUNTER — Other Ambulatory Visit: Payer: Self-pay | Admitting: Family Medicine

## 2012-12-11 LAB — LIPID PANEL
Cholesterol: 193 mg/dL (ref 0–200)
HDL: 56 mg/dL (ref >39)
LDL Cholesterol: 113 mg/dL — ABNORMAL HIGH (ref 0–99)
Total CHOL/HDL Ratio: 3.4 Ratio
Triglycerides: 118 mg/dL (ref <150)
VLDL: 24 mg/dL (ref 0–40)

## 2012-12-12 ENCOUNTER — Encounter (INDEPENDENT_AMBULATORY_CARE_PROVIDER_SITE_OTHER): Payer: Self-pay | Admitting: *Deleted

## 2012-12-13 MED ORDER — METRONIDAZOLE 500 MG PO TABS: ORAL_TABLET | ORAL | Status: AC

## 2012-12-15 DIAGNOSIS — N760 Acute vaginitis: Secondary | ICD-10-CM | POA: Insufficient documentation

## 2012-12-15 NOTE — Assessment & Plan Note (Signed)
Reports that flashes are disabling, wants to try natural means, info given on this in print as well as discussed

## 2012-12-15 NOTE — Assessment & Plan Note (Signed)
unchanged Patient counseled for approximately 5 minutes regarding the health risks of ongoing nicotine use, specifically all types of cancer, heart disease, stroke and respiratory failure. The options available for help with cessation ,the behavioral changes to assist the process, and the option to either gradully reduce usage  Or abruptly stop.is also discussed. Pt is also encouraged to set specific goals in number of cigarettes used daily, as well as to set a quit date.  

## 2012-12-15 NOTE — Assessment & Plan Note (Signed)
Annual exam as documented. Pt counselled re need to stop smoking but no interest in quitting at this time Encouraged to continue regualr physical activity, and healthy diet to maintain healthy weight and good labs. Also due to elevated diastolic pressure needs to be diligent

## 2012-12-15 NOTE — Assessment & Plan Note (Signed)
Specimens sent, positive for Bv and trichomonas, pt treated after results available

## 2013-04-24 ENCOUNTER — Telehealth: Payer: Self-pay

## 2013-04-24 NOTE — Telephone Encounter (Signed)
Patient states she has had a bad cough for 2 days. Producing yellow phlegm. Hasn't felt good and has been laying around. No fever or bodyaches reported. Has been taking mucinex with no relief. No openings tomorrow. [facility] [phone]

## 2013-04-25 MED ORDER — BENZONATATE 100 MG PO CAPS: 100.0000 mg | ORAL_CAPSULE | Freq: Three times a day (TID) | ORAL | Status: DC | PRN

## 2013-04-25 MED ORDER — SULFAMETHOXAZOLE-TMP DS 800-160 MG PO TABS: 1.0000 | ORAL_TABLET | Freq: Two times a day (BID) | ORAL | Status: DC

## 2013-04-25 NOTE — Telephone Encounter (Signed)
Patient states she went to the Ed and got some medicine and made appt for Monday

## 2013-04-25 NOTE — Telephone Encounter (Signed)
Pls Send in septra ds 1 twice daily #14 and tessalon perles 100mg   3 times daily as needed #21 let her know pls  She needs to be seen on Monday if she will need a work excuse or if no better, so she needs an appt offered and given  for 2/18

## 2013-04-28 ENCOUNTER — Ambulatory Visit (INDEPENDENT_AMBULATORY_CARE_PROVIDER_SITE_OTHER): Payer: BC Managed Care – PPO | Admitting: Family Medicine

## 2013-04-28 ENCOUNTER — Encounter (INDEPENDENT_AMBULATORY_CARE_PROVIDER_SITE_OTHER): Payer: Self-pay

## 2013-04-28 ENCOUNTER — Encounter: Payer: Self-pay | Admitting: Family Medicine

## 2013-04-28 VITALS — BP 124/84 | HR 75 | Resp 16 | Wt 178.0 lb

## 2013-04-28 DIAGNOSIS — M25519 Pain in unspecified shoulder: Secondary | ICD-10-CM

## 2013-04-28 DIAGNOSIS — J0100 Acute maxillary sinusitis, unspecified: Secondary | ICD-10-CM

## 2013-04-28 DIAGNOSIS — J111 Influenza due to unidentified influenza virus with other respiratory manifestations: Secondary | ICD-10-CM

## 2013-04-28 DIAGNOSIS — F17200 Nicotine dependence, unspecified, uncomplicated: Secondary | ICD-10-CM

## 2013-04-28 DIAGNOSIS — Z139 Encounter for screening, unspecified: Secondary | ICD-10-CM

## 2013-04-28 DIAGNOSIS — M25512 Pain in left shoulder: Secondary | ICD-10-CM

## 2013-04-28 MED ORDER — OSELTAMIVIR PHOSPHATE 75 MG PO CAPS: 75.0000 mg | ORAL_CAPSULE | Freq: Two times a day (BID) | ORAL | Status: DC

## 2013-04-28 MED ORDER — PREDNISONE 5 MG PO TABS: 5.0000 mg | ORAL_TABLET | Freq: Two times a day (BID) | ORAL | Status: DC

## 2013-04-28 NOTE — Patient Instructions (Signed)
F/U in 3.5 months, call if you need me before CXR and X ray of left shoulder today please  Tamiflu to be taken for 5 days , start today. Prednisone for 5 days , start next Wednesday Work excuse from 2/11 return 04/30/2013  You need to quit smoking for health reasons  It is important that you exercise regularly at least 30 minutes 5 times a week. If you develop chest pain, have severe difficulty breathing, or feel very tired, stop exercising immediately and seek medical attention    A healthy diet is rich in fruit, vegetables and whole grains. Poultry fish, nuts and beans are a healthy choice for protein rather then red meat. A low sodium diet and drinking 64 ounces of water daily is generally recommended. Oils and sweet should be limited. Carbohydrates especially for those who are diabetic or overweight, should be limited to 45 to 60 gram per meal. It is important to eat on a regular schedule, at least 3 times daily. Snacks should be primarily fruits, vegetables or nuts.  Adequate rest, generally 6 to 8 hours per night is important for good health.Good sleep hygiene involves setting a regular bedtime, and turning off all sound and light in your sleep environment.Limiting caffeine intake will also help with the ability to rest well  Lab work needs to be done 3 to 5 days before your follow up visit please.CBC, fasting lipid and chem 7 ansd TSH  All medications need to be brought to every visit  Smoking Cessation Quitting smoking is important to your health and has many advantages. However, it is not always easy to quit since nicotine is a very addictive drug. Often times, people try 3 times or more before being able to quit. This document explains the best ways for you to prepare to quit smoking. Quitting takes hard work and a lot of effort, but you can do it. ADVANTAGES OF QUITTING SMOKING  You will live longer, feel better, and live better.  Your body will feel the impact of quitting smoking  almost immediately.  Within 20 minutes, blood pressure decreases. Your pulse returns to its normal level.  After 8 hours, carbon monoxide levels in the blood return to normal. Your oxygen level increases.  After 24 hours, the chance of having a heart attack starts to decrease. Your breath, hair, and body stop smelling like smoke.  After 48 hours, damaged nerve endings begin to recover. Your sense of taste and smell improve.  After 72 hours, the body is virtually free of nicotine. Your bronchial tubes relax and breathing becomes easier.  After 2 to 12 weeks, lungs can hold more air. Exercise becomes easier and circulation improves.  The risk of having a heart attack, stroke, cancer, or lung disease is greatly reduced.  After 1 year, the risk of coronary heart disease is cut in half.  After 5 years, the risk of stroke falls to the same as a nonsmoker.  After 10 years, the risk of lung cancer is cut in half and the risk of other cancers decreases significantly.  After 15 years, the risk of coronary heart disease drops, usually to the level of a nonsmoker.  If you are pregnant, quitting smoking will improve your chances of having a healthy baby.  The people you live with, especially any children, will be healthier.  You will have extra money to spend on things other than cigarettes. QUESTIONS TO THINK ABOUT BEFORE ATTEMPTING TO QUIT You may want to talk about your  answers with your caregiver.  Why do you want to quit?  If you tried to quit in the past, what helped and what did not?  What will be the most difficult situations for you after you quit? How will you plan to handle them?  Who can help you through the tough times? Your family? Friends? A caregiver?  What pleasures do you get from smoking? What ways can you still get pleasure if you quit? Here are some questions to ask your caregiver:  How can you help me to be successful at quitting?  What medicine do you think  would be best for me and how should I take it?  What should I do if I need more help?  What is smoking withdrawal like? How can I get information on withdrawal? GET READY  Set a quit date.  Change your environment by getting rid of all cigarettes, ashtrays, matches, and lighters in your home, car, or work. Do not let people smoke in your home.  Review your past attempts to quit. Think about what worked and what did not. GET SUPPORT AND ENCOURAGEMENT You have a better chance of being successful if you have help. You can get support in many ways.  Tell your family, friends, and co-workers that you are going to quit and need their support. Ask them not to smoke around you.  Get individual, group, or telephone counseling and support. Programs are available at [employer] and health centers. Call your local health department for information about programs in your area.  Spiritual beliefs and practices may help some smokers quit.  Download a "quit meter" on your computer to keep track of quit statistics, such as how long you have gone without smoking, cigarettes not smoked, and money saved.  Get a self-help book about quitting smoking and staying off of tobacco. [place] yourself from urges to smoke. Talk to someone, go for a walk, or occupy your time with a task.  Change your normal routine. Take a different route to work. Drink tea instead of coffee. Eat breakfast in a different place.  Reduce your stress. Take a hot bath, exercise, or read a book.  Plan something enjoyable to do every day. Reward yourself for not smoking.  Explore interactive web-based programs that specialize in helping you quit. GET MEDICINE AND USE IT CORRECTLY Medicines can help you stop smoking and decrease the urge to smoke. Combining medicine with the above behavioral methods and support can greatly increase your chances of successfully quitting smoking.  Nicotine  replacement therapy helps deliver nicotine to your body without the negative effects and risks of smoking. Nicotine replacement therapy includes nicotine gum, lozenges, inhalers, nasal sprays, and skin patches. Some may be available over-the-counter and others require a prescription.  Antidepressant medicine helps people abstain from smoking, but how this works is unknown. This medicine is available by prescription.  Nicotinic receptor partial agonist medicine simulates the effect of nicotine in your brain. This medicine is available by prescription. Ask your caregiver for advice about which medicines to use and how to use them based on your health history. Your caregiver will tell you what side effects to look out for if you choose to be on a medicine or therapy. Carefully read the information on the package. Do not use any other product containing nicotine while using a nicotine replacement product.  RELAPSE OR DIFFICULT SITUATIONS Most relapses occur within the first 3 months after quitting. Do not  be discouraged if you start smoking again. Remember, most people try several times before finally quitting. You may have symptoms of withdrawal because your body is used to nicotine. You may crave cigarettes, be irritable, feel very hungry, cough often, get headaches, or have difficulty concentrating. The withdrawal symptoms are only temporary. They are strongest when you first quit, but they will go away within 10 14 days. To reduce the chances of relapse, try to:  Avoid drinking alcohol. Drinking lowers your chances of successfully quitting.  Reduce the amount of caffeine you consume. Once you quit smoking, the amount of caffeine in your body increases and can give you symptoms, such as a rapid heartbeat, sweating, and anxiety.  Avoid smokers because they can make you want to smoke.  Do not let weight gain distract you. Many smokers will gain weight when they quit, usually less than 10 pounds. Eat a  healthy diet and stay active. You can always lose the weight gained after you quit.  Find ways to improve your mood other than smoking. FOR MORE INFORMATION  www.smokefree.gov  Document Released: 02/21/2001 Document Revised: 08/29/2011 Document Reviewed: 06/08/2011 [facility] Patient Information 2014 [place], [place].

## 2013-04-28 NOTE — Progress Notes (Signed)
   Subjective:    Patient ID: Laura Guerrero, female    DOB: 1960-06-24, 53 y.o.   MRN: [id number]  HPI On feb 11 pt started feeling ill. Excessive cough, excessive cough , and developed generalized body aches, never had a fever, but did have chills,with yellow sputum by day 2, also experienced poor appetite. No GI symptoms Two days later she was seen in the Ed , z pack and  cheratussin prescribed, lab report also came over positive for influenza. Facial pressure , yellow green nasal drainage, no sore throat or ear pain    Review of Systems See HPI  Denies PND, orthopnea, palpitations and leg swelling Denies abdominal pain, nausea, vomiting,diarrhea or constipation.   Denies dysuria, frequency, hesitancy or incontinence. Denies headaches, seizures, numbness, or tingling. Denies depression, anxiety or insomnia. Denies skin break down or rash.        Objective:   Physical Exam BP 124/84  Pulse 75  Resp 16  Wt 178 lb (80.74 kg)  SpO2 99%  LMP 06/12/2011 Patient alert and oriented and in no cardiopulmonary distress.  HEENT: No facial asymmetry, EOMI, maxillary  sinus tenderness,  oropharynx pink and moist.  Neck supple anterior cervical bilateral  adenopathy.  Chest: decreased though adequate air entry, scattered crackles, no wheezes  CVS: S1, S2 no murmurs, no S3.  ABD: Soft non tender. Bowel sounds normal.  Ext: No edema  MS: Adequate ROM spine,  hips and knees.decreased ROM left shoulder  Skin: Intact, no ulcerations or rash noted.  Psych: Good eye contact, normal affect. Memory intact not anxious or depressed appearing.  CNS: CN 2-12 intact, power, tone and sensation normal throughout.        Assessment & Plan:  Influenza with other respiratory manifestations Recent confirmed case of influenza from ED eval 5 days ago, no tamiflu was prescribed, though she feels much better was prescribed z pack, will still go ahead and rx tamiflu due to severity of illness  in pt and in the community. Work note also provided  Shoulder pain, left Chronic left shoulder and arm pain recent flare, first since 12 month ago, needs x ray of shoulder , pain may be originating from her spine also  NICOTINE ADDICTION Unchanged, current is 12 per day, still unwilling to set quitr date Patient counseled for approximately 5 minutes regarding the health risks of ongoing nicotine use, specifically all types of cancer, heart disease, stroke and respiratory failure. The options available for help with cessation ,the behavioral changes to assist the process, and the option to either gradully reduce usage  Or abruptly stop.is also discussed. Pt is also encouraged to set specific goals in number of cigarettes used daily, as well as to set a quit date.   Acute maxillary sinusitis Antidotic and decongestant prescribed.

## 2013-04-29 DIAGNOSIS — J0100 Acute maxillary sinusitis, unspecified: Secondary | ICD-10-CM | POA: Insufficient documentation

## 2013-04-29 NOTE — Assessment & Plan Note (Signed)
Chronic left shoulder and arm pain recent flare, first since 12 month ago, needs x ray of shoulder , pain may be originating from her spine also

## 2013-04-29 NOTE — Assessment & Plan Note (Signed)
Antidotic and decongestant prescribed.

## 2013-04-29 NOTE — Assessment & Plan Note (Signed)
Recent confirmed case of influenza from ED eval 5 days ago, no tamiflu was prescribed, though she feels much better was prescribed z pack, will still go ahead and rx tamiflu due to severity of illness in pt and in the community. Work note also provided

## 2013-04-29 NOTE — Assessment & Plan Note (Signed)
Unchanged, current is 12 per day, still unwilling to set quitr date Patient counseled for approximately 5 minutes regarding the health risks of ongoing nicotine use, specifically all types of cancer, heart disease, stroke and respiratory failure. The options available for help with cessation ,the behavioral changes to assist the process, and the option to either gradully reduce usage  Or abruptly stop.is also discussed. Pt is also encouraged to set specific goals in number of cigarettes used daily, as well as to set a quit date.

## 2013-12-29 ENCOUNTER — Telehealth: Payer: Self-pay

## 2013-12-29 NOTE — Telephone Encounter (Signed)
noted 

## 2014-01-08 NOTE — Telephone Encounter (Signed)
Noted and am aware

## 2014-01-15 ENCOUNTER — Telehealth: Payer: Self-pay | Admitting: Family Medicine

## 2014-01-15 NOTE — Telephone Encounter (Signed)
noted 

## 2014-01-15 NOTE — Telephone Encounter (Signed)
fyi

## 2014-03-19 ENCOUNTER — Ambulatory Visit: Payer: BC Managed Care – PPO | Admitting: Family Medicine

## 2014-03-19 ENCOUNTER — Encounter: Payer: Self-pay | Admitting: *Deleted

## 2014-06-18 ENCOUNTER — Encounter: Payer: Self-pay | Admitting: Family Medicine

## 2014-06-18 ENCOUNTER — Ambulatory Visit (INDEPENDENT_AMBULATORY_CARE_PROVIDER_SITE_OTHER): Payer: 59 | Admitting: Family Medicine

## 2014-06-18 ENCOUNTER — Other Ambulatory Visit (HOSPITAL_COMMUNITY)
Admission: RE | Admit: 2014-06-18 | Discharge: 2014-06-18 | Disposition: A | Discharge status: Home / Self Care | Payer: 59 | Source: Ambulatory Visit | Attending: Family Medicine | Admitting: Family Medicine

## 2014-06-18 VITALS — BP 126/82 | HR 80 | Resp 18 | Ht 67.0 in | Wt 176.1 lb

## 2014-06-18 DIAGNOSIS — F17200 Nicotine dependence, unspecified, uncomplicated: Secondary | ICD-10-CM

## 2014-06-18 DIAGNOSIS — Z1211 Encounter for screening for malignant neoplasm of colon: Secondary | ICD-10-CM | POA: Diagnosis not present

## 2014-06-18 DIAGNOSIS — Z113 Encounter for screening for infections with a predominantly sexual mode of transmission: Secondary | ICD-10-CM | POA: Diagnosis present

## 2014-06-18 DIAGNOSIS — Z01419 Encounter for gynecological examination (general) (routine) without abnormal findings: Secondary | ICD-10-CM | POA: Insufficient documentation

## 2014-06-18 DIAGNOSIS — Z124 Encounter for screening for malignant neoplasm of cervix: Secondary | ICD-10-CM

## 2014-06-18 DIAGNOSIS — Z720 Tobacco use: Secondary | ICD-10-CM

## 2014-06-18 DIAGNOSIS — Z1231 Encounter for screening mammogram for malignant neoplasm of breast: Secondary | ICD-10-CM | POA: Diagnosis not present

## 2014-06-18 DIAGNOSIS — N760 Acute vaginitis: Secondary | ICD-10-CM | POA: Diagnosis present

## 2014-06-18 DIAGNOSIS — Z0000 Encounter for general adult medical examination without abnormal findings: Secondary | ICD-10-CM

## 2014-06-18 LAB — POC HEMOCCULT BLD/STL (OFFICE/1-CARD/DIAGNOSTIC): Fecal Occult Blood, POC: NEGATIVE

## 2014-06-18 NOTE — Progress Notes (Signed)
   Subjective:    Patient ID: Laura Guerrero, female    DOB: [birth year], 54 y.o.   MRN: [id number]  HPI  Patient is in for annual physical exam. No other health concerns are expressed or addressed at the visit. Recent labs, if available are reviewed. Immunization is reviewed , and  updated if needed.   Review of Systems    See HPI  Objective:   Physical Exam BP 126/82 mmHg  Pulse 80  Resp 18  Ht 5\' 7"  (1.702 m)  Wt 176 lb 1.3 oz (79.869 kg)  BMI 27.57 kg/m2  SpO2 100%  LMP 06/12/2011  Pleasant well nourished female, alert and oriented x 3, in no cardio-pulmonary distress. Afebrile. HEENT No facial trauma or asymetry. Sinuses non tender.  Extra occullar muscles intact, pupils equally reactive to light. External ears normal, tympanic membranes clear. Oropharynx moist, no exudate, good dentition. Neck: supple, no adenopathy,JVD or thyromegaly.No bruits.  Chest: Clear to ascultation bilaterally.No crackles or wheezes. Non tender to palpation  Breast: No asymetry,no masses or lumps. No tenderness. No nipple discharge or inversion. No axillary or supraclavicular adenopathy  Cardiovascular system; Heart sounds normal,  S1 and  S2 ,no S3.  No murmur, or thrill. Apical beat not displaced Peripheral pulses normal.  Abdomen: Soft, non tender, no organomegaly or masses. No bruits. Bowel sounds normal. No guarding, tenderness or rebound.  Rectal:  Normal sphincter tone. No mass.No rectal masses.  Guaiac negative stool.  GU: External genitalia normal female genitalia , female distribution of hair. No lesions. Urethral meatus normal in size, no  Prolapse, no lesions visibly  Present. Bladder non tender. Vagina pink and moist , with no visible lesions , discharge present . Adequate pelvic support no  cystocele or rectocele noted Cervix pink and appears healthy, no lesions or ulcerations noted, no discharge noted from os Uterus normal size, no adnexal masses, no  cervical motion or adnexal tenderness.   Musculoskeletal exam: Full ROM of spine, hips , shoulders and knees. No deformity ,swelling or crepitus noted. No muscle wasting or atrophy.   Neurologic: Cranial nerves 2 to 12 intact. Power, tone ,sensation and reflexes normal throughout. No disturbance in gait. No tremor.  Skin: Intact, no ulceration, erythema , scaling or rash noted. Pigmentation normal throughout  Psych; Normal mood and affect. Judgement and concentration normal        Assessment & Plan:  Annual physical exam Annual exam as documented. Counseling done  re healthy lifestyle involving commitment to 150 minutes exercise per week, heart healthy diet, and attaining healthy weight.The importance of adequate sleep also discussed. Regular seat belt use and home safety, is also discussed. Changes in health habits are decided on by the patient with goals and time frames  set for achieving them. Immunization and cancer screening needs are specifically addressed at this visit.    NICOTINE ADDICTION Patient counseled for approximately 5 minutes regarding the health risks of ongoing nicotine use, specifically all types of cancer, heart disease, stroke and respiratory failure. The options available for help with cessation ,the behavioral changes to assist the process, and the option to either gradully reduce usage  Or abruptly stop.is also discussed. Pt is also encouraged to set specific goals in number of cigarettes used daily, as well as to set a quit date.  Number of cigarettes/cigars currently smoking daily: 15

## 2014-06-18 NOTE — Assessment & Plan Note (Signed)

## 2014-06-18 NOTE — Assessment & Plan Note (Signed)

## 2014-06-18 NOTE — Patient Instructions (Addendum)
F/u in 6 month call if you need me before  CBC, fasting lipid, chem 7 , TSH and vit D today  Congrats on deciding to cut back smoking with a view to quitting, each month you will have $130 more, which is over $1000 per year, AND your health will be 500% better CALL 1800 QUIT NOW for help  You are referred again for screening colonoscopy in June per your request, you absolutely need rto keep this appt  You will be referred to [facility] in [place] for mammogram  It is important that you exercise regularly at least 30 minutes 5 times a week. If you develop chest pain, have severe difficulty breathing, or feel very tired, stop exercising immediately and seek medical attention     A healthy diet is rich in fruit, vegetables and whole grains. Poultry fish, nuts and beans are a healthy choice for protein rather then red meat. A low sodium diet and drinking 64 ounces of water daily is generally recommended. Oils and sweet should be limited. Carbohydrates especially for those who are diabetic or overweight, should be limited to 60-45 gram per meal. It is important to eat on a regular schedule, at least 3 times daily. Snacks should be primarily fruits, vegetables or nuts.

## 2014-06-19 ENCOUNTER — Other Ambulatory Visit: Payer: Self-pay

## 2014-06-19 DIAGNOSIS — Z1231 Encounter for screening mammogram for malignant neoplasm of breast: Secondary | ICD-10-CM

## 2014-06-19 LAB — CBC WITH DIFFERENTIAL/PLATELET
BASOS PCT: 0 % (ref 0–1)
Basophils Absolute: 0 10*3/uL (ref 0.0–0.1)
Eosinophils Absolute: 0 10*3/uL (ref 0.0–0.7)
Eosinophils Relative: 0 % (ref 0–5)
HEMATOCRIT: 35.2 % — AB (ref 36.0–46.0)
HEMOGLOBIN: 12 g/dL (ref 12.0–15.0)
Lymphocytes Relative: 47 % — ABNORMAL HIGH (ref 12–46)
Lymphs Abs: 2.2 10*3/uL (ref 0.7–4.0)
MCH: 29.1 pg (ref 26.0–34.0)
MCHC: 34.1 g/dL (ref 30.0–36.0)
MCV: 85.4 fL (ref 78.0–100.0)
MONO ABS: 0.2 10*3/uL (ref 0.1–1.0)
MPV: 9.6 fL (ref 8.6–12.4)
Monocytes Relative: 5 % (ref 3–12)
NEUTROS PCT: 48 % (ref 43–77)
Neutro Abs: 2.3 10*3/uL (ref 1.7–7.7)
Platelets: 287 10*3/uL (ref 150–400)
RBC: 4.12 MIL/uL (ref 3.87–5.11)
RDW: 13.9 % (ref 11.5–15.5)
WBC: 4.7 10*3/uL (ref 4.0–10.5)

## 2014-06-19 LAB — BASIC METABOLIC PANEL
BUN: 11 mg/dL (ref 6–23)
CALCIUM: 9.5 mg/dL (ref 8.4–10.5)
CO2: 26 mEq/L (ref 19–32)
Chloride: 101 mEq/L (ref 96–112)
Creat: 0.82 mg/dL (ref 0.50–1.10)
GLUCOSE: 84 mg/dL (ref 70–99)
Potassium: 4.7 mEq/L (ref 3.5–5.3)
Sodium: 139 mEq/L (ref 135–145)

## 2014-06-19 LAB — LIPID PANEL
CHOL/HDL RATIO: 3.7 ratio
CHOLESTEROL: 187 mg/dL (ref 0–200)
HDL: 50 mg/dL (ref >=46)
LDL Cholesterol: 116 mg/dL — ABNORMAL HIGH (ref 0–99)
Triglycerides: 107 mg/dL (ref <150)
VLDL: 21 mg/dL (ref 0–40)

## 2014-06-19 LAB — VITAMIN D 25 HYDROXY (VIT D DEFICIENCY, FRACTURES): Vit D, 25-Hydroxy: 20 ng/mL — ABNORMAL LOW (ref 30–100)

## 2014-06-19 LAB — TSH: TSH: 0.458 u[IU]/mL (ref 0.350–4.500)

## 2014-06-22 ENCOUNTER — Encounter (INDEPENDENT_AMBULATORY_CARE_PROVIDER_SITE_OTHER): Payer: Self-pay | Admitting: *Deleted

## 2014-06-22 LAB — CYTOLOGY - PAP

## 2014-06-22 LAB — CERVICOVAGINAL ANCILLARY ONLY: Wet Prep (BD Affirm): NEGATIVE

## 2014-07-10 ENCOUNTER — Other Ambulatory Visit: Payer: Self-pay

## 2014-07-10 MED ORDER — VITAMIN D (ERGOCALCIFEROL) 1.25 MG (50000 UNIT) PO CAPS: 50000.0000 [IU] | ORAL_CAPSULE | ORAL | Status: DC

## 2014-12-30 ENCOUNTER — Ambulatory Visit: Payer: 59 | Admitting: Family Medicine

## 2015-02-16 ENCOUNTER — Telehealth: Payer: Self-pay | Admitting: Family Medicine

## 2015-02-16 NOTE — Telephone Encounter (Signed)
Called and left message for patient to return call.  

## 2015-02-16 NOTE — Telephone Encounter (Signed)
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

## 2015-02-16 NOTE — Telephone Encounter (Signed)
Spoke with patient and she states that the soft area appeared spontaneously.  Area is on the right side of her forehead.   No injuries.  She is requesting an appointment for next week.  Does any imaging need to be done before?

## 2015-02-16 NOTE — Telephone Encounter (Signed)
No imaging study, needs to keep appt

## 2015-02-17 NOTE — Telephone Encounter (Signed)
Called and left message for patient to return call.  

## 2015-02-18 ENCOUNTER — Telehealth: Payer: Self-pay | Admitting: Family Medicine

## 2015-02-18 NOTE — Telephone Encounter (Signed)
Patient is coming in 12/14

## 2015-02-18 NOTE — Telephone Encounter (Signed)
Courtney please call Bea at [phone]

## 2015-02-18 NOTE — Telephone Encounter (Signed)
Called and left message for patient to return call.  

## 2015-02-24 ENCOUNTER — Encounter: Payer: Self-pay | Admitting: Family Medicine

## 2015-02-24 ENCOUNTER — Ambulatory Visit (INDEPENDENT_AMBULATORY_CARE_PROVIDER_SITE_OTHER): Payer: 59 | Admitting: Family Medicine

## 2015-02-24 ENCOUNTER — Other Ambulatory Visit (HOSPITAL_COMMUNITY)
Admission: RE | Admit: 2015-02-24 | Discharge: 2015-02-24 | Disposition: A | Discharge status: Home / Self Care | Payer: 59 | Source: Ambulatory Visit | Attending: Family Medicine | Admitting: Family Medicine

## 2015-02-24 VITALS — BP 128/80 | HR 74 | Resp 18 | Ht 67.0 in | Wt 181.0 lb

## 2015-02-24 DIAGNOSIS — N760 Acute vaginitis: Secondary | ICD-10-CM | POA: Diagnosis present

## 2015-02-24 DIAGNOSIS — D170 Benign lipomatous neoplasm of skin and subcutaneous tissue of head, face and neck: Secondary | ICD-10-CM | POA: Insufficient documentation

## 2015-02-24 DIAGNOSIS — E663 Overweight: Secondary | ICD-10-CM

## 2015-02-24 DIAGNOSIS — Z23 Encounter for immunization: Secondary | ICD-10-CM

## 2015-02-24 DIAGNOSIS — Z1329 Encounter for screening for other suspected endocrine disorder: Secondary | ICD-10-CM

## 2015-02-24 DIAGNOSIS — Z1159 Encounter for screening for other viral diseases: Secondary | ICD-10-CM

## 2015-02-24 DIAGNOSIS — Z113 Encounter for screening for infections with a predominantly sexual mode of transmission: Secondary | ICD-10-CM | POA: Diagnosis present

## 2015-02-24 DIAGNOSIS — N3001 Acute cystitis with hematuria: Secondary | ICD-10-CM | POA: Diagnosis not present

## 2015-02-24 DIAGNOSIS — F17200 Nicotine dependence, unspecified, uncomplicated: Secondary | ICD-10-CM

## 2015-02-24 DIAGNOSIS — E559 Vitamin D deficiency, unspecified: Secondary | ICD-10-CM

## 2015-02-24 DIAGNOSIS — Z13220 Encounter for screening for lipoid disorders: Secondary | ICD-10-CM

## 2015-02-24 LAB — POCT URINALYSIS DIPSTICK
Bilirubin, UA: NEGATIVE
Glucose, UA: NEGATIVE
Ketones, UA: NEGATIVE
Nitrite, UA: NEGATIVE
PROTEIN UA: NEGATIVE
Spec Grav, UA: 1.015
UROBILINOGEN UA: 0.2
pH, UA: 7

## 2015-02-24 NOTE — Assessment & Plan Note (Addendum)
2 month history malodorous d/c , specimens sent

## 2015-02-24 NOTE — Assessment & Plan Note (Signed)

## 2015-02-24 NOTE — Patient Instructions (Addendum)
CPE in 5 month, call if  you need me sooner  The swelling on the right side of your forehead is what we call a "fatty tumor" , this is not cconsidered a cancerous growth and it can be observed. Call iof increasing in size, painful, bleeding  No need to have this removed at this time, but it will not go away on its pwn and will likely get bigger over time  Urine test today,   Swabs for infection collected  Flu vaccine today  Current is 8 cigarettes daily try to reduce each month by 2 cigarettes, need to quit!  Fasting lipid, cbc, TSH , vit D, cmp and hep C screen in 5 month  PLS schedule mammogram and colonoscopy, if you need help with either call, both are past due  Thanks for choosing [facility], we consider it a privelige to serve you.  All the best for 2017!

## 2015-02-25 LAB — CERVICOVAGINAL ANCILLARY ONLY
Chlamydia: NEGATIVE
Neisseria Gonorrhea: NEGATIVE
WET PREP (BD AFFIRM): NEGATIVE

## 2015-02-26 LAB — URINE CULTURE

## 2015-02-26 MED ORDER — CIPROFLOXACIN HCL 500 MG PO TABS: 500.0000 mg | ORAL_TABLET | Freq: Two times a day (BID) | ORAL | Status: DC

## 2015-04-11 DIAGNOSIS — N3001 Acute cystitis with hematuria: Secondary | ICD-10-CM | POA: Insufficient documentation

## 2015-04-11 NOTE — Assessment & Plan Note (Signed)
Deteriorated. Patient re-educated about  the importance of commitment to a  minimum of 150 minutes of exercise per week.  The importance of healthy food choices with portion control discussed. Encouraged to start a food diary, count calories and to consider  joining a support group. Sample diet sheets offered. Goals set by the patient for the next several months.   Weight /BMI 02/24/2015 06/18/2014 04/28/2013  WEIGHT 181 lb 176 lb 1.3 oz 178 lb  HEIGHT 5\' 7"  5\' 7"  -  BMI 28.34 kg/m2 27.57 kg/m2 28.3 kg/m2    Current exercise per week 120 minutes.

## 2015-04-11 NOTE — Assessment & Plan Note (Signed)
Noted in 02/2015, not painful or red, max diameter less than 6 cm, observation at this time

## 2015-04-11 NOTE — Assessment & Plan Note (Signed)
Symptomatic with abnormal uA , will await c/s report

## 2015-04-11 NOTE — Progress Notes (Signed)
Subjective:    Patient ID: Laura Guerrero, female    DOB: 1960/09/06, 55 y.o.   MRN: [id number]  HPI   Cordia Griffel     MRN: [id number]      DOB: 02-01-1961   HPI Ms. Antunez is here for follow up and re-evaluation of chronic medical conditions, medication management and review of any available recent lab and radiology data.  Preventive health is updated, specifically  Cancer screening and Immunization.  Needs mammogram and colonoscopy  2 month h/o malodorous vaginal discharge, 1 week h/o increased urinary frequency with mild discomfort Soft non tender swelling noted on scalp, npo trauma, no change in size   ROS Denies recent fever or chills. Denies sinus pressure, nasal congestion, ear pain or sore throat. Denies chest congestion, productive cough or wheezing. Denies chest pains, palpitations and leg swelling Denies abdominal pain, nausea, vomiting,diarrhea or constipation.   Denies joint pain, swelling and limitation in mobility. Denies headaches, seizures, numbness, or tingling. Denies depression, anxiety or insomnDenies skin break down or rash.   PE  BP 128/80 mmHg  Pulse 74  Resp 18  Ht 5\' 7"  (1.702 m)  Wt 181 lb (82.101 kg)  BMI 28.34 kg/m2  SpO2 100%  LMP 06/12/2011  Patient alert and oriented and in no cardiopulmonary distress.  HEENT: No facial asymmetry, EOMI,   oropharynx pink and moist.  Neck supple no JVD, no mass.  Chest: Clear to auscultation bilaterally.Decreased though adequate air entry  CVS: S1, S2 no murmurs, no S3.Regular rate.  ABD: Soft non tender.No renal angle , but mild suprapubic tenderness  Pelvic: white discharge, uterus normal size, cervix firm , no tender, no adnexal tenderness or mass Ext: No edema  MS: Adequate ROM spine, shoulders, hips and knees.  Skin: Intact, no ulcerations or rash noted.Lipoma noted on scalp, max diameter between 4 to 5 cm, non tender , no erythema , no drainage  Psych: Good eye contact, normal affect. Memory  intact not anxious or depressed appearing.  CNS: CN 2-12 intact, power,  normal throughout.no focal deficits noted.   Assessment & Plan   NICOTINE ADDICTION Patient counseled for approximately 5 minutes regarding the health risks of ongoing nicotine use, specifically all types of cancer, heart disease, stroke and respiratory failure. The options available for help with cessation ,the behavioral changes to assist the process, and the option to either gradully reduce usage  Or abruptly stop.is also discussed. Pt is also encouraged to set specific goals in number of cigarettes used daily, as well as to set a quit date.  Number of cigarettes/cigars currently smoking daily:8   Vaginitis and vulvovaginitis 2 month history malodorous d/c , specimens sent  Lipoma of scalp Noted in 02/2015, not painful or red, max diameter less than 6 cm, observation at this time   Overweight (BMI 25.0-29.9) Deteriorated. Patient re-educated about  the importance of commitment to a  minimum of 150 minutes of exercise per week.  The importance of healthy food choices with portion control discussed. Encouraged to start a food diary, count calories and to consider  joining a support group. Sample diet sheets offered. Goals set by the patient for the next several months.   Weight /BMI 02/24/2015 06/18/2014 04/28/2013  WEIGHT 181 lb 176 lb 1.3 oz 178 lb  HEIGHT 5\' 7"  5\' 7"  -  BMI 28.34 kg/m2 27.57 kg/m2 28.3 kg/m2    Current exercise per week 120 minutes.   Acute cystitis with hematuria Symptomatic with abnormal uA ,  will await c/s report       Review of Systems     Objective:   Physical Exam        Assessment & Plan:

## 2015-07-12 ENCOUNTER — Telehealth: Payer: Self-pay | Admitting: Family Medicine

## 2015-07-12 DIAGNOSIS — D170 Benign lipomatous neoplasm of skin and subcutaneous tissue of head, face and neck: Secondary | ICD-10-CM

## 2015-07-12 NOTE — Telephone Encounter (Signed)
Pt is referred to Dr Adline Mango office, pls let her  know

## 2015-07-12 NOTE — Telephone Encounter (Signed)
States she was here in a December for a lipoma of the scalp and she said it is still there and its getting larger and she wants to be referred to a specialist to find out what its coming from and if it needs to be removed. Please advise

## 2015-07-12 NOTE — Telephone Encounter (Signed)
Patient is asking for a nurse to return her call regarding the knot that has been on her forehead, please advise?

## 2015-07-12 NOTE — Telephone Encounter (Signed)
Pt aware.

## 2015-07-26 ENCOUNTER — Encounter: Payer: 59 | Admitting: Family Medicine

## 2015-12-15 ENCOUNTER — Telehealth: Payer: Self-pay | Admitting: Family Medicine

## 2015-12-15 DIAGNOSIS — Z139 Encounter for screening, unspecified: Secondary | ICD-10-CM

## 2015-12-15 DIAGNOSIS — Z1329 Encounter for screening for other suspected endocrine disorder: Secondary | ICD-10-CM

## 2015-12-15 DIAGNOSIS — Z1159 Encounter for screening for other viral diseases: Secondary | ICD-10-CM

## 2015-12-15 DIAGNOSIS — Z1321 Encounter for screening for nutritional disorder: Secondary | ICD-10-CM

## 2015-12-15 DIAGNOSIS — Z13220 Encounter for screening for lipoid disorders: Secondary | ICD-10-CM

## 2015-12-15 MED ORDER — VITAMIN D (ERGOCALCIFEROL) 1.25 MG (50000 UNIT) PO CAPS: 50000.0000 [IU] | ORAL_CAPSULE | ORAL | 1 refills | Status: DC

## 2015-12-15 NOTE — Telephone Encounter (Signed)
Labs reordered and medication refilled.

## 2015-12-15 NOTE — Telephone Encounter (Signed)
Courtney please update [facility] in the computer, she is going to have labs drawn for Fasting lipid, cbc, TSH , vit D, cmp and hep C screen and she is also asking for a refill on Vitamin D, Ergocalciferol, (DRISDOL) 50000 UNITS CAPS capsule, please advise

## 2015-12-30 ENCOUNTER — Encounter: Payer: Self-pay | Admitting: Family Medicine

## 2015-12-30 ENCOUNTER — Other Ambulatory Visit (HOSPITAL_COMMUNITY)
Admission: RE | Admit: 2015-12-30 | Discharge: 2015-12-30 | Disposition: A | Discharge status: Home / Self Care | Payer: 59 | Source: Ambulatory Visit | Attending: Family Medicine | Admitting: Family Medicine

## 2015-12-30 ENCOUNTER — Telehealth: Payer: Self-pay | Admitting: Family Medicine

## 2015-12-30 ENCOUNTER — Ambulatory Visit (INDEPENDENT_AMBULATORY_CARE_PROVIDER_SITE_OTHER): Payer: 59 | Admitting: Family Medicine

## 2015-12-30 VITALS — BP 124/80 | HR 70 | Ht 67.0 in | Wt 181.0 lb

## 2015-12-30 DIAGNOSIS — Z1151 Encounter for screening for human papillomavirus (HPV): Secondary | ICD-10-CM | POA: Diagnosis not present

## 2015-12-30 DIAGNOSIS — Z1211 Encounter for screening for malignant neoplasm of colon: Secondary | ICD-10-CM | POA: Diagnosis not present

## 2015-12-30 DIAGNOSIS — J0100 Acute maxillary sinusitis, unspecified: Secondary | ICD-10-CM

## 2015-12-30 DIAGNOSIS — Z0000 Encounter for general adult medical examination without abnormal findings: Secondary | ICD-10-CM

## 2015-12-30 DIAGNOSIS — E663 Overweight: Secondary | ICD-10-CM

## 2015-12-30 DIAGNOSIS — Z113 Encounter for screening for infections with a predominantly sexual mode of transmission: Secondary | ICD-10-CM | POA: Insufficient documentation

## 2015-12-30 DIAGNOSIS — N760 Acute vaginitis: Secondary | ICD-10-CM | POA: Diagnosis present

## 2015-12-30 DIAGNOSIS — Z124 Encounter for screening for malignant neoplasm of cervix: Secondary | ICD-10-CM

## 2015-12-30 DIAGNOSIS — J209 Acute bronchitis, unspecified: Secondary | ICD-10-CM

## 2015-12-30 DIAGNOSIS — Z01419 Encounter for gynecological examination (general) (routine) without abnormal findings: Secondary | ICD-10-CM | POA: Insufficient documentation

## 2015-12-30 DIAGNOSIS — F17200 Nicotine dependence, unspecified, uncomplicated: Secondary | ICD-10-CM | POA: Diagnosis not present

## 2015-12-30 LAB — CBC
HCT: 36.7 % (ref 35.0–45.0)
HEMOGLOBIN: 12.6 g/dL (ref 11.7–15.5)
MCH: 29.6 pg (ref 27.0–33.0)
MCHC: 34.3 g/dL (ref 32.0–36.0)
MCV: 86.2 fL (ref 80.0–100.0)
MPV: 9.5 fL (ref 7.5–12.5)
Platelets: 285 10*3/uL (ref 140–400)
RBC: 4.26 MIL/uL (ref 3.80–5.10)
RDW: 14 % (ref 11.0–15.0)
WBC: 5.5 10*3/uL (ref 3.8–10.8)

## 2015-12-30 LAB — POC HEMOCCULT BLD/STL (OFFICE/1-CARD/DIAGNOSTIC): Fecal Occult Blood, POC: NEGATIVE

## 2015-12-30 MED ORDER — METRONIDAZOLE 500 MG PO TABS: 500.0000 mg | ORAL_TABLET | Freq: Three times a day (TID) | ORAL | 0 refills | Status: DC

## 2015-12-30 MED ORDER — BENZONATATE 100 MG PO CAPS: 100.0000 mg | ORAL_CAPSULE | Freq: Two times a day (BID) | ORAL | 0 refills | Status: DC | PRN

## 2015-12-30 MED ORDER — FLUCONAZOLE 150 MG PO TABS: ORAL_TABLET | ORAL | 0 refills | Status: DC

## 2015-12-30 NOTE — Assessment & Plan Note (Signed)
Specimen sent, presume BV

## 2015-12-30 NOTE — Progress Notes (Signed)
Laura Guerrero     MRN: [id number]      DOB: 06/30/1960  HPI: Patient is in for annual physical exam. 3 week h/o right maxillary pressure, yellow drainage and cough productive of yellow sputum Immunization is reviewed , hold on flu vaccine today, as ill Labs being drawn today   PE: Pleasant  female, alert and oriented x 3, in no cardio-pulmonary distress. Afebrile. HEENT No facial trauma or asymetry. Right maxillary Sinus is  tender.  Extra occullar muscles intact,  External ears normal, tympanic membranes clear. Oropharynx moist, no exudate. Neck: supple, no adenopathy,JVD or thyromegaly.No bruits.  Chest: Adequate air entry bilaterally, few right basilar crackles, no wheezes. Non tender to palpation  Breast: No asymetry,no masses or lumps. No tenderness. No nipple discharge or inversion. No axillary or supraclavicular adenopathy  Cardiovascular system; Heart sounds normal,  S1 and  S2 ,no S3.  No murmur, or thrill. Apical beat not displaced Peripheral pulses normal.  Abdomen: Soft, non tender, no organomegaly or masses. No bruits. Bowel sounds normal. No guarding, tenderness or rebound.  Rectal:  Normal sphincter tone. No rectal mass. Guaiac negative stool.  GU: External genitalia normal female genitalia , normal female distribution of hair. No lesions. Urethral meatus normal in size, no  Prolapse, no lesions visibly  Present. Bladder non tender. Vagina pink and moist , with no visible lesions ,fishy  discharge present . Adequate pelvic support no  cystocele or rectocele noted Cervix pink and appears healthy, no lesions or ulcerations noted, no discharge noted from os Uterus normal size, no adnexal masses, no cervical motion or adnexal tenderness.   Musculoskeletal exam: Full ROM of spine, hips , shoulders and knees. No deformity ,swelling or crepitus noted. No muscle wasting or atrophy.   Neurologic: Cranial nerves 2 to 12 intact. Power, tone  ,sensation and reflexes normal throughout. No disturbance in gait. No tremor.  Skin: Intact, no ulceration, erythema , scaling or rash noted. Pigmentation normal throughout  Psych; Normal mood and affect. Judgement and concentration normal   Assessment & Plan:  Annual physical exam Annual exam as documented. Counseling done  re healthy lifestyle involving commitment to 150 minutes exercise per week, heart healthy diet, and attaining healthy weight.The importance of adequate sleep also discussed. Regular seat belt use and home safety, is also discussed. Changes in health habits are decided on by the patient with goals and time frames  set for achieving them. Immunization and cancer screening needs are specifically addressed at this visit.   NICOTINE ADDICTION Patient counseled for approximately 5 minutes regarding the health risks of ongoing nicotine use, specifically all types of cancer, heart disease, stroke and respiratory failure. The options available for help with cessation ,the behavioral changes to assist the process, and the option to either gradully reduce usage  Or abruptly stop.is also discussed. Pt is also encouraged to set specific goals in number of cigarettes used daily, as well as to set a quit date.     Overweight (BMI 25.0-29.9) unchanged Patient re-educated about  the importance of commitment to a  minimum of 150 minutes of exercise per week.  The importance of healthy food choices with portion control discussed. Encouraged to start a food diary, count calories and to consider  joining a support group. Sample diet sheets offered. Goals set by the patient for the next several months.   Weight /BMI 12/30/2015 02/24/2015 06/18/2014  WEIGHT 181 lb 181 lb 176 lb 1.3 oz  HEIGHT 5\' 7"  5'  7" 5\' 7"   BMI 28.35 kg/m2 28.34 kg/m2 27.57 kg/m2      Acute bronchitis 3 week h/o symptoms and abn exam, flagyll, , tessalon perles and fluconazole prescribed  Maxillary  sinusitis, acute Right maxillary tenderness, with yellow nasal drainage, antibiotic prescribed  Vaginitis and vulvovaginitis Specimen sent, presume BV

## 2015-12-30 NOTE — Assessment & Plan Note (Signed)

## 2015-12-30 NOTE — Telephone Encounter (Signed)
[name] is calling stating that Laura Guerrero requested an A1C be drawn today as well, [place]ma drew an extra tube of blood she states she just needs the orders placed in the computer, please advise?

## 2015-12-30 NOTE — Assessment & Plan Note (Signed)

## 2015-12-30 NOTE — Assessment & Plan Note (Signed)
unchanged Patient re-educated about  the importance of commitment to a  minimum of 150 minutes of exercise per week.  The importance of healthy food choices with portion control discussed. Encouraged to start a food diary, count calories and to consider  joining a support group. Sample diet sheets offered. Goals set by the patient for the next several months.   Weight /BMI 12/30/2015 02/24/2015 06/18/2014  WEIGHT 181 lb 181 lb 176 lb 1.3 oz  HEIGHT 5\' 7"  5\' 7"  5\' 7"   BMI 28.35 kg/m2 28.34 kg/m2 27.57 kg/m2

## 2015-12-30 NOTE — Patient Instructions (Addendum)
Annual exam in 12 months, call if you need me sooner  Pls return in 3 weeks for flu vaccine, discuss with nurse  Calcium 1000 to 1200 mg daily  You are treated for acute sinusitis and bronchitis,ations sent in  Labs today  pLEASE work on sTOPPING smoking, now at 10/ day, tHERE IS hEALTH BENEFIT!!!!\\Thank you  for choosing [facility]. We consider it a privelige to serve you.  Delivering excellent health care in a caring and  compassionate way is our goal.  Partnering with you,  so that together we can achieve this goal is our strategy.

## 2015-12-30 NOTE — Assessment & Plan Note (Signed)
3 week h/o symptoms and abn exam, flagyll, , tessalon perles and fluconazole prescribed

## 2015-12-30 NOTE — Addendum Note (Signed)
Addended by: Eual Fines on: 12/30/2015 09:42 AM   Modules accepted: Orders

## 2015-12-30 NOTE — Telephone Encounter (Signed)
Orders placed.

## 2015-12-30 NOTE — Assessment & Plan Note (Signed)
Right maxillary tenderness, with yellow nasal drainage, antibiotic prescribed

## 2015-12-31 LAB — COMPREHENSIVE METABOLIC PANEL
ALT: 41 U/L — AB (ref 6–29)
AST: 39 U/L — ABNORMAL HIGH (ref 10–35)
Albumin: 4.1 g/dL (ref 3.6–5.1)
Alkaline Phosphatase: 75 U/L (ref 33–130)
BUN: 13 mg/dL (ref 7–25)
CHLORIDE: 105 mmol/L (ref 98–110)
CO2: 26 mmol/L (ref 20–31)
Calcium: 9.3 mg/dL (ref 8.6–10.4)
Creat: 0.86 mg/dL (ref 0.50–1.05)
GLUCOSE: 88 mg/dL (ref 65–99)
POTASSIUM: 4.4 mmol/L (ref 3.5–5.3)
Sodium: 140 mmol/L (ref 135–146)
Total Bilirubin: 0.6 mg/dL (ref 0.2–1.2)
Total Protein: 6.9 g/dL (ref 6.1–8.1)

## 2015-12-31 LAB — LIPID PANEL
CHOLESTEROL: 212 mg/dL — AB (ref 125–200)
HDL: 58 mg/dL (ref >=46)
LDL CALC: 130 mg/dL — AB (ref <130)
Total CHOL/HDL Ratio: 3.7 Ratio (ref <=5.0)
Triglycerides: 120 mg/dL (ref <150)
VLDL: 24 mg/dL (ref <30)

## 2015-12-31 LAB — CYTOLOGY - PAP
Chlamydia: NEGATIVE
DIAGNOSIS: NEGATIVE
HPV (WINDOPATH): NOT DETECTED
NEISSERIA GONORRHEA: NEGATIVE

## 2015-12-31 LAB — HEMOGLOBIN A1C
HEMOGLOBIN A1C: 5.1 % (ref <5.7)
MEAN PLASMA GLUCOSE: 100 mg/dL

## 2015-12-31 LAB — VITAMIN D 25 HYDROXY (VIT D DEFICIENCY, FRACTURES): VIT D 25 HYDROXY: 37 ng/mL (ref 30–100)

## 2015-12-31 LAB — HEPATITIS C ANTIBODY: HCV AB: NEGATIVE

## 2015-12-31 LAB — TSH: TSH: 1.06 m[IU]/L

## 2015-12-31 LAB — CERVICOVAGINAL ANCILLARY ONLY: Wet Prep (BD Affirm): POSITIVE — AB

## 2016-08-30 ENCOUNTER — Encounter (HOSPITAL_COMMUNITY): Payer: Self-pay | Admitting: Emergency Medicine

## 2016-08-30 ENCOUNTER — Emergency Department (HOSPITAL_COMMUNITY)
Admission: EM | Admit: 2016-08-30 | Discharge: 2016-08-30 | Disposition: A | Discharge status: Home / Self Care | Attending: Physician Assistant | Admitting: Physician Assistant

## 2016-08-30 DIAGNOSIS — T571X1A Toxic effect of phosphorus and its compounds, accidental (unintentional), initial encounter: Secondary | ICD-10-CM | POA: Diagnosis not present

## 2016-08-30 DIAGNOSIS — Y999 Unspecified external cause status: Secondary | ICD-10-CM | POA: Diagnosis not present

## 2016-08-30 DIAGNOSIS — Y9389 Activity, other specified: Secondary | ICD-10-CM | POA: Diagnosis not present

## 2016-08-30 DIAGNOSIS — T2040XA Corrosion of unspecified degree of head, face, and neck, unspecified site, initial encounter: Secondary | ICD-10-CM

## 2016-08-30 DIAGNOSIS — Y33XXXA Other specified events, undetermined intent, initial encounter: Secondary | ICD-10-CM | POA: Insufficient documentation

## 2016-08-30 DIAGNOSIS — Y929 Unspecified place or not applicable: Secondary | ICD-10-CM | POA: Diagnosis not present

## 2016-08-30 DIAGNOSIS — Z79899 Other long term (current) drug therapy: Secondary | ICD-10-CM | POA: Insufficient documentation

## 2016-08-30 DIAGNOSIS — F17200 Nicotine dependence, unspecified, uncomplicated: Secondary | ICD-10-CM | POA: Diagnosis not present

## 2016-08-30 MED ORDER — BACITRACIN ZINC 500 UNIT/GM EX OINT
TOPICAL_OINTMENT | Freq: Two times a day (BID) | CUTANEOUS | Status: DC
  Administered 2016-08-30: 1 via TOPICAL

## 2016-08-30 NOTE — ED Provider Notes (Signed)
[facility] DEPT Provider Note   CSN: [id number] Arrival date & time: 08/30/16  2041   By signing my name below, I, Norris Cross, attest that this documentation has been prepared under the direction and in the presence of [facility], NP-C. Electronically Signed: Norris Cross , ED Scribe. 08/31/16. 6:00 PM.    History   Chief Complaint Chief Complaint  Patient presents with  . Chemical Exposure    HPI Laura Guerrero is a 56 y.o. female who presents to the Emergency Department for evaluation s/p chemical exposure with onset x2 hours. Pt states that while filling a cylinder with phosphoric acid, the chemical splashed and hit her mouth cover. Pt was wearing PPE but states that the chemical came in contact with her R cheek prompting her to flush her face with water immediately. She describes the sensation as burning. Pt denies any modifying factors. She denies SOB. Tetanus up to date.  The history is provided by the patient. No language interpreter was used.    Past Medical History:  Diagnosis Date  . Fracture of finger, middle phalanx, right, closed 03/2012   no surgical intervention    Patient Active Problem List   Diagnosis Date Noted  . Lipoma of scalp 02/24/2015  . Vaginitis and vulvovaginitis 02/24/2015  . Maxillary sinusitis, acute 04/29/2013  . Hot flashes, menopausal 12/10/2012  . Acute bronchitis 03/08/2012  . Overweight (BMI 25.0-29.9) 09/13/2011  . Annual physical exam 09/13/2011  . NICOTINE ADDICTION 08/11/2009    History reviewed. No pertinent surgical history.  OB History    No data available       Home Medications    Prior to Admission medications   Medication Sig Start Date End Date Taking? Authorizing Provider  benzonatate (TESSALON) 100 MG capsule Take 1 capsule (100 mg total) by mouth 2 (two) times daily as needed for cough. 12/30/15   Fayrene Helper, MD  fluconazole (DIFLUCAN) 150 MG tablet One tablet once, as needed for vaginitis  12/30/15   Fayrene Helper, MD  metroNIDAZOLE (FLAGYL) 500 MG tablet Take 1 tablet (500 mg total) by mouth 3 (three) times daily. 12/30/15   Fayrene Helper, MD  Multiple Vitamin (MULTIVITAMIN) tablet Take 1 tablet by mouth as needed.     [provider]  Vitamin D, Ergocalciferol, (DRISDOL) 50000 units CAPS capsule Take 1 capsule (50,000 Units total) by mouth every 7 (seven) days. 12/15/15   Fayrene Helper, MD    Family History Family History  Problem Relation Age of Onset  . Diabetes Mother   . Hypertension Father   . Hypertension Sister   . Stroke Sister   . Heart disease Maternal Grandmother     Social History Social History  Substance Use Topics  . Smoking status: Current Every Day Smoker    Packs/day: 0.50  . Smokeless tobacco: Never Used  . Alcohol use No     Allergies   Patient has no known allergies.   Review of Systems Review of Systems  Constitutional: Negative for fever.  HENT: Negative for trouble swallowing.   Eyes: Negative for visual disturbance.  Respiratory: Negative for shortness of breath.   Gastrointestinal: Negative for nausea and vomiting.  Skin: Positive for wound.       Chemical exposure to face  Psychiatric/Behavioral: The patient is not nervous/anxious.      Physical Exam Updated Vital Signs BP 126/80 (BP Location: Left Arm)   Pulse 90   Temp 98.1 F (36.7 C) (Oral)  Resp 20   Ht 5\' 7"  (1.702 m)   Wt 81.6 kg (180 lb)   LMP 06/12/2011   SpO2 99%   BMI 28.19 kg/m   Physical Exam  Constitutional: She is oriented to person, place, and time. She appears well-developed and well-nourished. No distress.  HENT:  Head:    Nose: No mucosal edema.  Mouth/Throat: Uvula is midline, oropharynx is clear and moist and mucous membranes are normal. No posterior oropharyngeal edema or posterior oropharyngeal erythema.  Tiny blister area to the right cheek where the chemical touched.   Eyes: EOM are normal. Pupils are  equal, round, and reactive to light.  Sclera clear  Neck: Neck supple.  Cardiovascular: Normal rate and regular rhythm.   Pulmonary/Chest: Effort normal and breath sounds normal.  Lungs clear.  Abdominal: Soft. There is no tenderness.  Musculoskeletal: Normal range of motion.  Neurological: She is alert and oriented to person, place, and time. No cranial nerve deficit.  Skin: Skin is warm and dry. There is erythema.  Tiny blisters to the R cheek that are tender. There is mild erythema to the area.  Psychiatric: She has a normal mood and affect. Her behavior is normal.  Nursing note and vitals reviewed.    ED Treatments / Results   DIAGNOSTIC STUDIES: Oxygen Saturation is 99% on RA, normal by my interpretation.   COORDINATION OF CARE: 6:00 PM-Discussed next steps with pt. Pt verbalized understanding and is agreeable with the plan.    Radiology No results found.  Procedures Procedures (including critical care time)  Medications Ordered in ED Medications - No data to display   Initial Impression / Assessment and Plan / ED Course  I have reviewed the triage vital signs and the nursing notes.   Final Clinical Impressions(s) / ED Diagnoses  56 y.o. female with chemical exposure to the right side of face stable for d/c without visual changes no eye exposure and minimal erythema. Wound cleaned and bacitracin ointment applied. Patient to follow up with the nurse at work. Return precautions.  Final diagnoses:  Chemical burn of face, initial encounter    New Prescriptions Discharge Medication List as of 08/30/2016 11:38 PM    I personally performed the services described in this documentation, which was scribed in my presence. The recorded information has been reviewed and is accurate.    Debroah Baller [place], [place] 08/31/16 1801    Macarthur Critchley, MD 09/01/16 [phone]

## 2016-08-30 NOTE — Discharge Instructions (Signed)
Take tylenol and ibuprofen as needed for pain. Follow up with your nurse where you work. Return here as needed.

## 2016-08-30 NOTE — ED Triage Notes (Signed)
Pt had phosphoric acid splashed on face at work tonight. Pt flushed the area. Mild redness noted above right upper lip. No pain/discomfort at this time.

## 2016-09-21 ENCOUNTER — Telehealth: Payer: Self-pay | Admitting: Family Medicine

## 2016-09-21 NOTE — Telephone Encounter (Signed)
New Message  Pt voiced she has toe fungus and wants to know if we can prescribe her some cream for her toes fungus.  Please f/u

## 2016-09-21 NOTE — Telephone Encounter (Signed)
Called patient regarding message below. No answer, left generic message for patient to return call.   

## 2016-09-21 NOTE — Telephone Encounter (Signed)
Pt called and will try the lamisil

## 2016-09-21 NOTE — Telephone Encounter (Signed)
Use over the counter lamisil

## 2016-12-05 ENCOUNTER — Emergency Department (HOSPITAL_COMMUNITY)
Admission: EM | Admit: 2016-12-05 | Discharge: 2016-12-05 | Disposition: A | Discharge status: Home / Self Care | Payer: 59 | Attending: Emergency Medicine | Admitting: Emergency Medicine

## 2016-12-05 ENCOUNTER — Encounter (HOSPITAL_COMMUNITY): Payer: Self-pay | Admitting: Emergency Medicine

## 2016-12-05 DIAGNOSIS — F17210 Nicotine dependence, cigarettes, uncomplicated: Secondary | ICD-10-CM | POA: Diagnosis not present

## 2016-12-05 DIAGNOSIS — R21 Rash and other nonspecific skin eruption: Secondary | ICD-10-CM

## 2016-12-05 DIAGNOSIS — Z79899 Other long term (current) drug therapy: Secondary | ICD-10-CM | POA: Insufficient documentation

## 2016-12-05 DIAGNOSIS — T7840XA Allergy, unspecified, initial encounter: Secondary | ICD-10-CM

## 2016-12-05 MED ORDER — PREDNISONE 10 MG (21) PO TBPK: ORAL_TABLET | ORAL | 0 refills | Status: DC

## 2016-12-05 MED ORDER — DIPHENHYDRAMINE HCL 25 MG PO CAPS
25.0000 mg | ORAL_CAPSULE | Freq: Once | ORAL | Status: AC
  Administered 2016-12-05: 25 mg via ORAL
  Filled 2016-12-05: qty 1

## 2016-12-05 MED ORDER — PREDNISONE 50 MG PO TABS
60.0000 mg | ORAL_TABLET | Freq: Once | ORAL | Status: AC
  Administered 2016-12-05: 60 mg via ORAL
  Filled 2016-12-05: qty 1

## 2016-12-05 MED ORDER — DIPHENHYDRAMINE HCL 25 MG PO CAPS: 25.0000 mg | ORAL_CAPSULE | Freq: Four times a day (QID) | ORAL | 0 refills | Status: DC | PRN

## 2016-12-05 NOTE — ED Notes (Signed)
ED Provider at bedside. 

## 2016-12-05 NOTE — ED Triage Notes (Signed)
Last night lips swollen and rash on face, took a benadryl, lips are better, rash now on arms and legs.

## 2016-12-05 NOTE — ED Provider Notes (Signed)
[facility] DEPT Provider Note   CSN: [id number] Arrival date & time: 12/05/16  1300     History   Chief Complaint Chief Complaint  Patient presents with  . Allergic Reaction    HPI Laura Guerrero is a 56 y.o. female.  HPI Patient presents to emergency for evaluation of a rash. Patient states she was doing yard work last week but did not have any issues. This morning when she woke up she noticed a rash on her face and her lips were swollen. She took some Benadryl and her lips improved but she still has had the rash throughout the day. She denies any new medications or detergents. No recent hair dyes or exposures to any other chemicals. Patient is not having any breathing issues or any difficulty swallowing. She has not had prior allergic reaction symptoms. Past Medical History:  Diagnosis Date  . Fracture of finger, middle phalanx, right, closed 03/2012   no surgical intervention    Patient Active Problem List   Diagnosis Date Noted  . Lipoma of scalp 02/24/2015  . Vaginitis and vulvovaginitis 02/24/2015  . Maxillary sinusitis, acute 04/29/2013  . Hot flashes, menopausal 12/10/2012  . Acute bronchitis 03/08/2012  . Overweight (BMI 25.0-29.9) 09/13/2011  . Annual physical exam 09/13/2011  . NICOTINE ADDICTION 08/11/2009    No past surgical history on file.  OB History    No data available       Home Medications    Prior to Admission medications   Medication Sig Start Date End Date Taking? Authorizing Provider  benzonatate (TESSALON) 100 MG capsule Take 1 capsule (100 mg total) by mouth 2 (two) times daily as needed for cough. 12/30/15   Fayrene Helper, MD  diphenhydrAMINE (BENADRYL) 25 mg capsule Take 1 capsule (25 mg total) by mouth every 6 (six) hours as needed. 12/05/16   Dorie Rank, MD  fluconazole (DIFLUCAN) 150 MG tablet One tablet once, as needed for vaginitis 12/30/15   Fayrene Helper, MD  metroNIDAZOLE (FLAGYL) 500 MG tablet Take 1 tablet (500  mg total) by mouth 3 (three) times daily. 12/30/15   Fayrene Helper, MD  Multiple Vitamin (MULTIVITAMIN) tablet Take 1 tablet by mouth as needed.     [provider]  predniSONE (STERAPRED UNI-PAK 21 TAB) 10 MG (21) TBPK tablet Take 6 tabs by mouth daily  for 2 days, then 5 tabs for 2 days, then 4 tabs for 2 days, then 3 tabs for 2 days, 2 tabs for 2 days, then 1 tab by mouth daily for 2 days 12/05/16   Dorie Rank, MD  Vitamin D, Ergocalciferol, (DRISDOL) 50000 units CAPS capsule Take 1 capsule (50,000 Units total) by mouth every 7 (seven) days. 12/15/15   Fayrene Helper, MD    Family History Family History  Problem Relation Age of Onset  . Diabetes Mother   . Hypertension Father   . Hypertension Sister   . Stroke Sister   . Heart disease Maternal Grandmother     Social History Social History  Substance Use Topics  . Smoking status: Current Every Day Smoker    Packs/day: 0.50  . Smokeless tobacco: Never Used  . Alcohol use No     Allergies   Patient has no known allergies.   Review of Systems Review of Systems  All other systems reviewed and are negative.    Physical Exam Updated Vital Signs BP 136/79 (BP Location: Right Arm)   Pulse 87   Temp 98.2 F (  36.8 C) (Oral)   Resp 20   Ht 1.702 m (5\' 7" )   Wt 81.6 kg (180 lb)   LMP 06/12/2011   SpO2 97%   BMI 28.19 kg/m   Physical Exam  Constitutional: She appears well-developed and well-nourished. No distress.  HENT:  Head: Normocephalic and atraumatic.  Right Ear: External ear normal.  Left Ear: External ear normal.  Mouth/Throat: Oropharynx is clear and moist.  No oropharyngeal edema  Eyes: Conjunctivae are normal. Right eye exhibits no discharge. Left eye exhibits no discharge. No scleral icterus.  Neck: Neck supple. No tracheal deviation present.  Cardiovascular: Normal rate, regular rhythm and intact distal pulses.   Pulmonary/Chest: Effort normal and breath sounds normal. No stridor. No  respiratory distress. She has no wheezes. She has no rales.  Abdominal: Soft. Bowel sounds are normal. She exhibits no distension. There is no tenderness. There is no rebound and no guarding.  Musculoskeletal: She exhibits no edema or tenderness.  Neurological: She is alert. She has normal strength. No cranial nerve deficit (no facial droop, extraocular movements intact, no slurred speech) or sensory deficit. She exhibits normal muscle tone. She displays no seizure activity. Coordination normal.  Skin: Skin is warm and dry. Rash noted. Rash is papular and urticarial. Rash is not pustular.  Rash involving her neck and torso and extremities  Psychiatric: She has a normal mood and affect.  Nursing note and vitals reviewed.    ED Treatments / Results    Procedures Procedures (including critical care time)  Medications Ordered in ED Medications  predniSONE (DELTASONE) tablet 60 mg (not administered)  diphenhydrAMINE (BENADRYL) capsule 25 mg (not administered)     Initial Impression / Assessment and Plan / ED Course  I have reviewed the triage vital signs and the nursing notes.  Pertinent labs & imaging results that were available during my care of the patient were reviewed by me and considered in my medical decision making (see chart for details).   patient has a rash involving her torso and extremities. Possible this was triggered by some type of environmental exposure. Patient is not having any respiratory difficulty.No airway symptoms. Will discharge home on a course of steroids and antihistamines.  Final Clinical Impressions(s) / ED Diagnoses   Final diagnoses:  Rash  Allergic reaction, initial encounter    New Prescriptions New Prescriptions   DIPHENHYDRAMINE (BENADRYL) 25 MG CAPSULE    Take 1 capsule (25 mg total) by mouth every 6 (six) hours as needed.   PREDNISONE (STERAPRED UNI-PAK 21 TAB) 10 MG (21) TBPK TABLET    Take 6 tabs by mouth daily  for 2 days, then 5 tabs for 2  days, then 4 tabs for 2 days, then 3 tabs for 2 days, 2 tabs for 2 days, then 1 tab by mouth daily for 2 days     Dorie Rank, MD 12/05/16 1740

## 2017-01-01 ENCOUNTER — Encounter: Payer: 59 | Admitting: Family Medicine

## 2017-01-10 ENCOUNTER — Other Ambulatory Visit: Payer: Self-pay | Admitting: Family Medicine

## 2017-01-10 ENCOUNTER — Encounter: Payer: Self-pay | Admitting: Family Medicine

## 2017-01-10 ENCOUNTER — Other Ambulatory Visit (HOSPITAL_COMMUNITY)
Admission: RE | Admit: 2017-01-10 | Discharge: 2017-01-10 | Disposition: A | Discharge status: Home / Self Care | Payer: 59 | Source: Ambulatory Visit | Attending: Family Medicine | Admitting: Family Medicine

## 2017-01-10 ENCOUNTER — Ambulatory Visit (INDEPENDENT_AMBULATORY_CARE_PROVIDER_SITE_OTHER): Payer: 59 | Admitting: Family Medicine

## 2017-01-10 VITALS — BP 124/82 | HR 88 | Resp 16 | Ht 67.0 in | Wt 184.0 lb

## 2017-01-10 DIAGNOSIS — Z0000 Encounter for general adult medical examination without abnormal findings: Secondary | ICD-10-CM | POA: Diagnosis not present

## 2017-01-10 DIAGNOSIS — N760 Acute vaginitis: Secondary | ICD-10-CM | POA: Diagnosis present

## 2017-01-10 DIAGNOSIS — F17200 Nicotine dependence, unspecified, uncomplicated: Secondary | ICD-10-CM | POA: Diagnosis not present

## 2017-01-10 DIAGNOSIS — Z23 Encounter for immunization: Secondary | ICD-10-CM | POA: Diagnosis not present

## 2017-01-10 DIAGNOSIS — M79672 Pain in left foot: Secondary | ICD-10-CM | POA: Diagnosis not present

## 2017-01-10 DIAGNOSIS — Z1231 Encounter for screening mammogram for malignant neoplasm of breast: Secondary | ICD-10-CM

## 2017-01-10 MED ORDER — IBUPROFEN 800 MG PO TABS: 800.0000 mg | ORAL_TABLET | Freq: Three times a day (TID) | ORAL | 1 refills | Status: DC | PRN

## 2017-01-10 MED ORDER — PREDNISONE 5 MG (21) PO TBPK: 5.0000 mg | ORAL_TABLET | ORAL | 0 refills | Status: DC

## 2017-01-10 MED ORDER — IBUPROFEN 800 MG PO TABS
800.0000 mg | ORAL_TABLET | Freq: Three times a day (TID) | ORAL | 1 refills | Status: DC | PRN
Start: 2017-01-10 — End: 2017-01-10

## 2017-01-10 MED ORDER — PREDNISONE 5 MG (21) PO TBPK
5.0000 mg | ORAL_TABLET | ORAL | 0 refills | Status: DC
Start: 2017-01-10 — End: 2018-01-23

## 2017-01-10 NOTE — Patient Instructions (Addendum)
F/u in 6  months, call if you need me sooner  Mammogram will be scheduled at checkout  You DO need a colonoscopy, please continue to think about this  Flu vaccine today  Please work on cutting back on cigarette smoking , this is dangerous for your health as it increases the risk of all types of cancer, stroke and  Heart disease   Prednisone and ibuprofen are sent for left foot pain short term  Fasting labs asap, cBC, lipd , cmp and eGFR , TSH and vit D  We will call re results of tests sent today

## 2017-01-11 ENCOUNTER — Encounter: Payer: Self-pay | Admitting: Family Medicine

## 2017-01-11 DIAGNOSIS — M79672 Pain in left foot: Secondary | ICD-10-CM | POA: Insufficient documentation

## 2017-01-11 NOTE — Assessment & Plan Note (Signed)
Short course of anti inflammatories prescribed, likely related to injury sustained while mowing grass

## 2017-01-11 NOTE — Assessment & Plan Note (Signed)

## 2017-01-11 NOTE — Assessment & Plan Note (Signed)
Specimens sent for testing for STD's per pt request, clinical suspicion for STD is low

## 2017-01-11 NOTE — Progress Notes (Signed)
Laura Guerrero     MRN: [id number]      DOB: [birth year]  HPI: Patient is in for annual physical exam. C/o left ankle pain x 2 weeks after mowing lawn , mainly after standing for short time. C/o vaginal discharge , wants STD testing. . Immunization is reviewed , and  updated    PE: BP 124/82   Pulse 88   Resp 16   Ht 5\' 7"  (1.702 m)   Wt 184 lb (83.5 kg)   LMP 06/12/2011   SpO2 97%   BMI 28.82 kg/m   Pleasant  female, alert and oriented x 3, in no cardio-pulmonary distress. Afebrile. HEENT No facial trauma or asymetry. Sinuses non tender.  Extra occullar muscles intact, pupils equally reactive to light. External ears normal, tympanic membranes clear. Oropharynx moist, no exudate. Neck: supple, no adenopathy,JVD or thyromegaly.No bruits.  Chest: Clear to ascultation bilaterally.No crackles or wheezes. Non tender to palpation  Breast: No asymetry,no masses or lumps. No tenderness. No nipple discharge or inversion. No axillary or supraclavicular adenopathy  Cardiovascular system; Heart sounds normal,  S1 and  S2 ,no S3.  No murmur, or thrill. Apical beat not displaced Peripheral pulses normal.  Abdomen: Soft, non tender, no organomegaly or masses. No bruits. Bowel sounds normal. No guarding, tenderness or rebound.  Rectal:  Normal sphincter tone. No rectal mass. Guaiac negative stool.  GU: External genitalia normal female genitalia , normal female distribution of hair. No lesions. Urethral meatus normal in size, no  Prolapse, no lesions visibly  Present. Bladder non tender. Vagina pink and moist , with no visible lesions ,physiologic  discharge present . Adequate pelvic support no  cystocele or rectocele noted Cervix pink and appears healthy, no lesions or ulcerations noted, no discharge noted from os Uterus normal size, no adnexal masses, no cervical motion or adnexal tenderness.   Musculoskeletal exam: Full ROM of spine, hips , shoulders and  knees. No deformity ,swelling or crepitus noted.Tender over lateral  left ankle with adequate ROM No muscle wasting or atrophy.   Neurologic: Cranial nerves 2 to 12 intact. Power, tone ,sensation and reflexes normal throughout. No disturbance in gait. No tremor.  Skin: Intact, no ulceration, erythema , scaling or rash noted. Pigmentation normal throughout  Psych; Normal mood and affect. Judgement and concentration normal   Assessment & Plan:  Annual physical exam Annual exam as documented. Counseling done  re healthy lifestyle involving commitment to 150 minutes exercise per week, heart healthy diet, and attaining healthy weight.The importance of adequate sleep also discussed. Regular seat belt use and home safety, is also discussed. Changes in health habits are decided on by the patient with goals and time frames  set for achieving them. Immunization and cancer screening needs are specifically addressed at this visit.   NICOTINE ADDICTION Patient is asked and  confirms current  Nicotine use.  Five to seven minutes of time is spent in counseling the patient of the need to quit smoking  Advice to quit is delivered clearly specifically in reducing the risk of developing heart disease, having a stroke, or of developing all types of cancer, especially lung and oral cancer. Improvement in breathing and exercise tolerance and quality of life is also discussed, as is the economic benefit.  Assessment of willingness to quit or to make an attempt to quit is made and documented  Assistance in quit attempt is made with several and varied options presented, based on patient's desire and need. These include  literature, local classes available, 1800 QUIT NOW number, OTC and prescription medication.  The GOAL to be NICOTINE FREE is re emphasized.  The patient has set a personal goal of either reduction or discontinuation and follow up is arranged between 6 an 16 weeks.    Acute foot  pain, left Short course of anti inflammatories prescribed, likely related to injury sustained while mowing grass  Vaginitis and vulvovaginitis Specimens sent for testing for STD's per pt request, clinical suspicion for STD is low

## 2017-01-11 NOTE — Assessment & Plan Note (Signed)

## 2017-01-12 LAB — CERVICOVAGINAL ANCILLARY ONLY
CHLAMYDIA, DNA PROBE: NEGATIVE
NEISSERIA GONORRHEA: NEGATIVE
WET PREP (BD AFFIRM): POSITIVE — AB

## 2017-01-15 ENCOUNTER — Other Ambulatory Visit: Payer: Self-pay | Admitting: Family Medicine

## 2017-01-19 ENCOUNTER — Other Ambulatory Visit: Payer: Self-pay

## 2017-01-19 MED ORDER — METRONIDAZOLE 500 MG PO TABS: 500.0000 mg | ORAL_TABLET | Freq: Two times a day (BID) | ORAL | 0 refills | Status: DC

## 2017-02-23 LAB — LIPID PANEL
Cholesterol: 207 mg/dL — ABNORMAL HIGH (ref <200)
HDL: 57 mg/dL (ref >50)
LDL CHOLESTEROL (CALC): 127 mg/dL — AB
NON-HDL CHOLESTEROL (CALC): 150 mg/dL — AB (ref <130)
TRIGLYCERIDES: 121 mg/dL (ref <150)
Total CHOL/HDL Ratio: 3.6 (calc) (ref <5.0)

## 2017-02-23 LAB — COMPLETE METABOLIC PANEL WITH GFR
AG Ratio: 1.6 (calc) (ref 1.0–2.5)
ALKALINE PHOSPHATASE (APISO): 69 U/L (ref 33–130)
ALT: 27 U/L (ref 6–29)
AST: 28 U/L (ref 10–35)
Albumin: 4 g/dL (ref 3.6–5.1)
BILIRUBIN TOTAL: 0.7 mg/dL (ref 0.2–1.2)
BUN: 13 mg/dL (ref 7–25)
CHLORIDE: 106 mmol/L (ref 98–110)
CO2: 29 mmol/L (ref 20–32)
Calcium: 9.6 mg/dL (ref 8.6–10.4)
Creat: 0.84 mg/dL (ref 0.50–1.05)
GFR, Est African American: 89 mL/min/{1.73_m2} (ref >=60)
GFR, Est Non African American: 77 mL/min/{1.73_m2} (ref >=60)
GLUCOSE: 95 mg/dL (ref 65–99)
Globulin: 2.5 g/dL (calc) (ref 1.9–3.7)
Potassium: 4.6 mmol/L (ref 3.5–5.3)
Sodium: 140 mmol/L (ref 135–146)
Total Protein: 6.5 g/dL (ref 6.1–8.1)

## 2017-02-23 LAB — CBC
HEMATOCRIT: 35.9 % (ref 35.0–45.0)
Hemoglobin: 12.1 g/dL (ref 11.7–15.5)
MCH: 29 pg (ref 27.0–33.0)
MCHC: 33.7 g/dL (ref 32.0–36.0)
MCV: 86.1 fL (ref 80.0–100.0)
MPV: 10.9 fL (ref 7.5–12.5)
Platelets: 262 10*3/uL (ref 140–400)
RBC: 4.17 10*6/uL (ref 3.80–5.10)
RDW: 13.2 % (ref 11.0–15.0)
WBC: 4.1 10*3/uL (ref 3.8–10.8)

## 2017-02-23 LAB — VITAMIN D 25 HYDROXY (VIT D DEFICIENCY, FRACTURES): VIT D 25 HYDROXY: 32 ng/mL (ref 30–100)

## 2017-02-23 LAB — TSH: TSH: 0.48 m[IU]/L (ref 0.40–4.50)

## 2017-04-04 DIAGNOSIS — Z719 Counseling, unspecified: Secondary | ICD-10-CM | POA: Diagnosis not present

## 2017-04-16 DIAGNOSIS — Z719 Counseling, unspecified: Secondary | ICD-10-CM | POA: Diagnosis not present

## 2017-04-23 DIAGNOSIS — Z719 Counseling, unspecified: Secondary | ICD-10-CM | POA: Diagnosis not present

## 2017-04-27 ENCOUNTER — Ambulatory Visit (HOSPITAL_COMMUNITY): Payer: 59

## 2017-04-30 DIAGNOSIS — Z719 Counseling, unspecified: Secondary | ICD-10-CM | POA: Diagnosis not present

## 2017-05-03 ENCOUNTER — Encounter (HOSPITAL_COMMUNITY): Payer: Self-pay

## 2017-05-03 ENCOUNTER — Ambulatory Visit (HOSPITAL_COMMUNITY)
Admission: RE | Admit: 2017-05-03 | Discharge: 2017-05-03 | Disposition: A | Discharge status: Home / Self Care | Payer: 59 | Source: Ambulatory Visit | Attending: Family Medicine | Admitting: Family Medicine

## 2017-05-03 DIAGNOSIS — Z1231 Encounter for screening mammogram for malignant neoplasm of breast: Secondary | ICD-10-CM | POA: Insufficient documentation

## 2017-07-03 ENCOUNTER — Other Ambulatory Visit: Payer: Self-pay | Admitting: Family Medicine

## 2017-07-10 ENCOUNTER — Ambulatory Visit: Payer: 59 | Admitting: Family Medicine

## 2017-07-12 ENCOUNTER — Ambulatory Visit: Payer: 59 | Admitting: Family Medicine

## 2017-10-19 ENCOUNTER — Other Ambulatory Visit: Payer: Self-pay | Admitting: Family Medicine

## 2018-01-23 ENCOUNTER — Encounter: Payer: Self-pay | Admitting: Family Medicine

## 2018-01-23 ENCOUNTER — Other Ambulatory Visit (HOSPITAL_COMMUNITY)
Admission: RE | Admit: 2018-01-23 | Discharge: 2018-01-23 | Disposition: A | Discharge status: Home / Self Care | Payer: 59 | Source: Ambulatory Visit | Attending: Family Medicine | Admitting: Family Medicine

## 2018-01-23 ENCOUNTER — Ambulatory Visit (INDEPENDENT_AMBULATORY_CARE_PROVIDER_SITE_OTHER): Payer: 59 | Admitting: Family Medicine

## 2018-01-23 VITALS — BP 138/62 | HR 66 | Resp 12 | Ht 67.0 in | Wt 184.1 lb

## 2018-01-23 DIAGNOSIS — Z0000 Encounter for general adult medical examination without abnormal findings: Secondary | ICD-10-CM | POA: Diagnosis not present

## 2018-01-23 DIAGNOSIS — Z1211 Encounter for screening for malignant neoplasm of colon: Secondary | ICD-10-CM | POA: Diagnosis not present

## 2018-01-23 DIAGNOSIS — N760 Acute vaginitis: Secondary | ICD-10-CM | POA: Insufficient documentation

## 2018-01-23 DIAGNOSIS — Z23 Encounter for immunization: Secondary | ICD-10-CM | POA: Diagnosis not present

## 2018-01-23 DIAGNOSIS — F17200 Nicotine dependence, unspecified, uncomplicated: Secondary | ICD-10-CM

## 2018-01-23 MED ORDER — METRONIDAZOLE 500 MG PO TABS: 500.0000 mg | ORAL_TABLET | Freq: Two times a day (BID) | ORAL | 1 refills | Status: DC

## 2018-01-23 NOTE — Patient Instructions (Addendum)
Annual exam with pap Nov 14 or after , 2020, call if you need me sooner  Please work on [place] smoking entirely, nopw at 6/ day, reduce risk of cancer,. Heart attack and stroke  TdAp and flu vaccines today Fasting labs mid December, reesult will be ,mailed  Specimen sent for testing, and 1 week of antibiotic prescribed. I have sent 1 refill to last for 1 year  It is important that you exercise regularly at least 30 minutes 5 times a week. If you develop chest pain, have severe difficulty breathing, or feel very tired, stop exercising immediately and seek medical attention  Thank you  for choosing [facility]. We consider it a privelige to serve you.  Delivering excellent health care in a caring and  compassionate way is our goal.  Partnering with you,  so that together we can achieve this goal is our strategy.    Steps to Quit Smoking Smoking tobacco can be bad for your health. It can also affect almost every organ in your body. Smoking puts you and people around you at risk for many serious long-lasting (chronic) diseases. Quitting smoking is hard, but it is one of the best things that you can do for your health. It is never too late to quit. What are the benefits of quitting smoking? When you quit smoking, you lower your risk for getting serious diseases and conditions. They can include:  Lung cancer or lung disease.  Heart disease.  Stroke.  Heart attack.  Not being able to have children (infertility).  Weak bones (osteoporosis) and broken bones (fractures).  If you have coughing, wheezing, and shortness of breath, those symptoms may get better when you quit. You may also get sick less often. If you are pregnant, quitting smoking can help to lower your chances of having a baby of low birth weight. What can I do to help me quit smoking? Talk with your doctor about what can help you quit smoking. Some things you can do (strategies) include:  Quitting smoking  totally, instead of slowly cutting back how much you smoke over a period of time.  Going to in-person counseling. You are more likely to quit if you go to many counseling sessions.  Using resources and support systems, such as: ? Database administrator with a Social worker. ? Phone quitlines. ? Careers information officer. ? Support groups or group counseling. ? Text messaging programs. ? Mobile phone apps or applications.  Taking medicines. Some of these medicines may have nicotine in them. If you are pregnant or breastfeeding, do not take any medicines to quit smoking unless your doctor says it is okay. Talk with your doctor about counseling or other things that can help you.  Talk with your doctor about using more than one strategy at the same time, such as taking medicines while you are also going to in-person counseling. This can help make quitting easier. What things can I do to make it easier to quit? Quitting smoking might feel very hard at first, but there is a lot that you can do to make it easier. Take these steps:  Talk to your family and friends. Ask them to support and encourage you.  Call phone quitlines, reach out to support groups, or work with a Social worker.  Ask people who smoke to not smoke around you.  Avoid places that make you want (trigger) to smoke, such as: ? Bars. ? Parties. ? Smoke-break areas at work.  Spend time with people who do  not smoke.  Lower the stress in your life. Stress can make you want to smoke. Try these things to help your stress: ? Getting regular exercise. ? Deep-breathing exercises. ? Yoga. ? Meditating. ? Doing a body scan. To do this, close your eyes, focus on one area of your body at a time from head to toe, and notice which parts of your body are tense. Try to relax the muscles in those areas.  Download or buy apps on your mobile phone or tablet that can help you stick to your quit plan. There are many free apps, such as QuitGuide from the [employer]  Office manager for Disease Control and Prevention). You can find more support from smokefree.gov and other websites.  This information is not intended to replace advice given to you by your health care provider. Make sure you discuss any questions you have with your health care provider. Document Released: 12/24/2008 Document Revised: 10/26/2015 Document Reviewed: 07/14/2014 Elsevier Interactive Patient Education  2018 [employer].

## 2018-01-23 NOTE — Progress Notes (Signed)
    Laura Guerrero     MRN: [id number]      DOB: October 19, 1960  HPI: Patient is in for annual physical exam. C/o fishy discharge, has h/o recurrent BV, wants her d/c checked.  Immunization is reviewed , and  updated.   PE: BP 138/62 (BP Location: Left Arm, Patient Position: Sitting, Cuff Size: Normal)   Pulse 66   Resp 12   Ht 5\' 7"  (1.702 m)   Wt 184 lb 1.3 oz (83.5 kg)   LMP 06/12/2011   SpO2 99% Comment: room air  BMI 28.83 kg/m   Pleasant  female, alert and oriented x 3, in no cardio-pulmonary distress. Afebrile. HEENT No facial trauma or asymetry. Sinuses non tender.  Extra occullar muscles intact, pupils equally reactive to light. External ears normal, tympanic membranes clear. Oropharynx moist, no exudate. Neck: supple, no adenopathy,JVD or thyromegaly.No bruits.  Chest: Clear to ascultation bilaterally.No crackles or wheezes. Non tender to palpation  Breast: No asymetry,no masses or lumps. No tenderness. No nipple discharge or inversion. No axillary or supraclavicular adenopathy  Cardiovascular system; Heart sounds normal,  S1 and  S2 ,no S3.  No murmur, or thrill. Apical beat not displaced Peripheral pulses normal.  Abdomen: Soft, non tender, no organomegaly or masses. No bruits. Bowel sounds normal. No guarding, tenderness or rebound. Referred for cologuard test , afraid of colonoscopy  GU: Not examined, self collected specimens are sent.   Musculoskeletal exam: Full ROM of spine, hips , shoulders and knees. No deformity ,swelling or crepitus noted. No muscle wasting or atrophy.   Neurologic: Cranial nerves 2 to 12 intact. Power, tone ,sensation and reflexes normal throughout. No disturbance in gait. No tremor.  Skin: Intact, no ulceration, erythema , scaling or rash noted. Pigmentation normal throughout  Psych; Normal mood and affect. Judgement and concentration normal   Assessment & Plan:  Annual physical exam Annual exam as  documented. Counseling done  re healthy lifestyle involving commitment to 150 minutes exercise per week, heart healthy diet, and attaining healthy weight.The importance of adequate sleep also discussed.  Changes in health habits are decided on by the patient with goals and time frames  set for achieving them. Immunization and cancer screening needs are specifically addressed at this visit.   NICOTINE ADDICTION Asked: confirms currently smoking cigarettes Asses: unwilling to set quit date Advise; needs to quit to reduce risk of stroke, MI and cancer Assist: counseled for 5 mins and literature provided Arrange : f/u in 4 to 6 months  Vaginitis and vulvovaginitis Symptomatic with c/o fishy vaginal d/c. Self collected specimens sent and presumptively treated for BV which she has had in the past

## 2018-01-25 LAB — CERVICOVAGINAL ANCILLARY ONLY
Bacterial vaginitis: NEGATIVE
CANDIDA VAGINITIS: NEGATIVE
CHLAMYDIA, DNA PROBE: NEGATIVE
NEISSERIA GONORRHEA: NEGATIVE
TRICH (WINDOWPATH): NEGATIVE

## 2018-01-27 ENCOUNTER — Encounter: Payer: Self-pay | Admitting: Family Medicine

## 2018-01-27 NOTE — Assessment & Plan Note (Signed)
Asked: confirms currently smoking cigarettes Asses: unwilling to set quit date Advise; needs to quit to reduce risk of stroke, MI and cancer Assist: counseled for 5 mins and literature provided Arrange : f/u in 4 to 6 months

## 2018-01-27 NOTE — Assessment & Plan Note (Signed)
Symptomatic with c/o fishy vaginal d/c. Self collected specimens sent and presumptively treated for BV which she has had in the past

## 2018-01-27 NOTE — Assessment & Plan Note (Signed)
Annual exam as documented. Counseling done  re healthy lifestyle involving commitment to 150 minutes exercise per week, heart healthy diet, and attaining healthy weight.The importance of adequate sleep also discussed. Changes in health habits are decided on by the patient with goals and time frames  set for achieving them. Immunization and cancer screening needs are specifically addressed at this visit. 

## 2018-01-29 ENCOUNTER — Telehealth: Payer: Self-pay | Admitting: *Deleted

## 2018-01-29 NOTE — Telephone Encounter (Signed)
Pt stated Abby called yesterday and was returning her call  Can call back at [phone]

## 2018-01-29 NOTE — Telephone Encounter (Signed)
Advised patient of test results with verbal understanding.

## 2018-03-21 DIAGNOSIS — Z1211 Encounter for screening for malignant neoplasm of colon: Secondary | ICD-10-CM | POA: Diagnosis not present

## 2018-03-21 LAB — COLOGUARD: Cologuard: NEGATIVE

## 2018-09-06 ENCOUNTER — Other Ambulatory Visit: Payer: Self-pay | Admitting: Family Medicine

## 2018-10-02 ENCOUNTER — Telehealth: Payer: Self-pay | Admitting: *Deleted

## 2018-10-02 NOTE — Telephone Encounter (Signed)
Pt called needing her vitamin [facility] in [place]

## 2018-10-03 ENCOUNTER — Other Ambulatory Visit: Payer: Self-pay

## 2018-10-03 NOTE — Telephone Encounter (Signed)
Prescription refilled last month w/ 5 refills. Called patient and left message stating she just needed to call the pharmacy and ask them to refill the prescription she needed. If she had an questions to please call the office back.

## 2019-01-08 ENCOUNTER — Telehealth: Payer: Self-pay

## 2019-01-08 ENCOUNTER — Other Ambulatory Visit: Payer: Self-pay | Admitting: Family Medicine

## 2019-01-08 ENCOUNTER — Ambulatory Visit (INDEPENDENT_AMBULATORY_CARE_PROVIDER_SITE_OTHER): Payer: 59 | Admitting: Family Medicine

## 2019-01-08 ENCOUNTER — Encounter: Payer: Self-pay | Admitting: Family Medicine

## 2019-01-08 ENCOUNTER — Other Ambulatory Visit: Payer: Self-pay

## 2019-01-08 ENCOUNTER — Telehealth: Payer: Self-pay | Admitting: *Deleted

## 2019-01-08 VITALS — BP 128/80 | HR 80 | Temp 96.9°F | Resp 15 | Ht 67.0 in | Wt 178.0 lb

## 2019-01-08 DIAGNOSIS — Z1231 Encounter for screening mammogram for malignant neoplasm of breast: Secondary | ICD-10-CM

## 2019-01-08 DIAGNOSIS — Z23 Encounter for immunization: Secondary | ICD-10-CM

## 2019-01-08 DIAGNOSIS — F17200 Nicotine dependence, unspecified, uncomplicated: Secondary | ICD-10-CM | POA: Diagnosis not present

## 2019-01-08 DIAGNOSIS — M25561 Pain in right knee: Secondary | ICD-10-CM

## 2019-01-08 DIAGNOSIS — Z13220 Encounter for screening for lipoid disorders: Secondary | ICD-10-CM

## 2019-01-08 DIAGNOSIS — E663 Overweight: Secondary | ICD-10-CM | POA: Diagnosis not present

## 2019-01-08 DIAGNOSIS — M79604 Pain in right leg: Secondary | ICD-10-CM | POA: Insufficient documentation

## 2019-01-08 DIAGNOSIS — E559 Vitamin D deficiency, unspecified: Secondary | ICD-10-CM

## 2019-01-08 MED ORDER — VITAMIN D (ERGOCALCIFEROL) 1.25 MG (50000 UNIT) PO CAPS: 50000.0000 [IU] | ORAL_CAPSULE | ORAL | 5 refills | Status: DC

## 2019-01-08 MED ORDER — CYCLOBENZAPRINE HCL 10 MG PO TABS: 10.0000 mg | ORAL_TABLET | Freq: Every day | ORAL | 0 refills | Status: DC

## 2019-01-08 MED ORDER — KETOROLAC TROMETHAMINE 60 MG/2ML IM SOLN
60.0000 mg | Freq: Once | INTRAMUSCULAR | Status: AC
  Administered 2019-01-08: 60 mg via INTRAMUSCULAR

## 2019-01-08 MED ORDER — IBUPROFEN 800 MG PO TABS: 800.0000 mg | ORAL_TABLET | Freq: Three times a day (TID) | ORAL | 0 refills | Status: DC

## 2019-01-08 MED ORDER — METHYLPREDNISOLONE ACETATE 80 MG/ML IJ SUSP
80.0000 mg | Freq: Once | INTRAMUSCULAR | Status: AC
  Administered 2019-01-08: 80 mg via INTRAMUSCULAR

## 2019-01-08 MED ORDER — FAMOTIDINE 40 MG PO TABS: 40.0000 mg | ORAL_TABLET | Freq: Every day | ORAL | 0 refills | Status: DC

## 2019-01-08 MED ORDER — PREDNISONE 10 MG PO TABS: ORAL_TABLET | ORAL | 0 refills | Status: DC

## 2019-01-08 NOTE — Telephone Encounter (Signed)
Medication sent in and she is aware ( flexeril)

## 2019-01-08 NOTE — Patient Instructions (Addendum)
Please keep appointment in November as before, call if you need me sooner  Please schedule mammogram at checkout  Flu vaccine today  For right knee pain, Toradol and depo medrol IM in the office. Please also take medications prescribed, starting today, ibuprofen and prednisone and pepcid ( to protect your stomach)  Labs today , cBC, lipid, cmp and eGFR, TSH and vit D  Thanks for choosing [facility], we consider it a privelige to serve you.

## 2019-01-08 NOTE — Assessment & Plan Note (Addendum)
6 day history, localized tenderness posterio medical aspect Uncontrolled.Toradol and depo medrol administered IM in the office , to be followed by a short course of oral prednisone and NSAIDS. Anti inflammatory also prescribed per pt request to help with sleep, short term

## 2019-01-08 NOTE — Telephone Encounter (Signed)
Pt forgot to tell dr simpson she needed a refill on her vitamin D

## 2019-01-08 NOTE — Telephone Encounter (Signed)
Patient also wanted a muscle relaxer to take at night so she can sleep and wants a call when it has been sent so she can go get it.

## 2019-01-08 NOTE — Telephone Encounter (Signed)
She told me at the visit and I sent this in

## 2019-01-09 LAB — COMPLETE METABOLIC PANEL WITH GFR
AG Ratio: 1.9 (calc) (ref 1.0–2.5)
ALT: 46 U/L — ABNORMAL HIGH (ref 6–29)
AST: 36 U/L — ABNORMAL HIGH (ref 10–35)
Albumin: 4.3 g/dL (ref 3.6–5.1)
Alkaline phosphatase (APISO): 70 U/L (ref 37–153)
BUN: 17 mg/dL (ref 7–25)
CO2: 31 mmol/L (ref 20–32)
Calcium: 9.9 mg/dL (ref 8.6–10.4)
Chloride: 103 mmol/L (ref 98–110)
Creat: 0.9 mg/dL (ref 0.50–1.05)
GFR, Est African American: 81 mL/min/{1.73_m2} (ref >=60)
GFR, Est Non African American: 70 mL/min/{1.73_m2} (ref >=60)
Globulin: 2.3 g/dL (calc) (ref 1.9–3.7)
Glucose, Bld: 90 mg/dL (ref 65–99)
Potassium: 4.7 mmol/L (ref 3.5–5.3)
Sodium: 140 mmol/L (ref 135–146)
Total Bilirubin: 0.5 mg/dL (ref 0.2–1.2)
Total Protein: 6.6 g/dL (ref 6.1–8.1)

## 2019-01-09 LAB — CBC
HCT: 33.9 % — ABNORMAL LOW (ref 35.0–45.0)
Hemoglobin: 11.3 g/dL — ABNORMAL LOW (ref 11.7–15.5)
MCH: 29.7 pg (ref 27.0–33.0)
MCHC: 33.3 g/dL (ref 32.0–36.0)
MCV: 89 fL (ref 80.0–100.0)
MPV: 10.4 fL (ref 7.5–12.5)
Platelets: 274 10*3/uL (ref 140–400)
RBC: 3.81 10*6/uL (ref 3.80–5.10)
RDW: 13.1 % (ref 11.0–15.0)
WBC: 5.7 10*3/uL (ref 3.8–10.8)

## 2019-01-09 LAB — LIPID PANEL
Cholesterol: 212 mg/dL — ABNORMAL HIGH (ref <200)
HDL: 54 mg/dL (ref >=50)
LDL Cholesterol (Calc): 132 mg/dL (calc) — ABNORMAL HIGH
Non-HDL Cholesterol (Calc): 158 mg/dL (calc) — ABNORMAL HIGH (ref <130)
Total CHOL/HDL Ratio: 3.9 (calc) (ref <5.0)
Triglycerides: 149 mg/dL (ref <150)

## 2019-01-09 LAB — VITAMIN D 25 HYDROXY (VIT D DEFICIENCY, FRACTURES): Vit D, 25-Hydroxy: 44 ng/mL (ref 30–100)

## 2019-01-09 LAB — TSH: TSH: 1.59 mIU/L (ref 0.40–4.50)

## 2019-01-12 ENCOUNTER — Encounter: Payer: Self-pay | Admitting: Family Medicine

## 2019-01-12 NOTE — Assessment & Plan Note (Signed)
Asked:confirms currently smokes cigarettes Assess: Unwilling to quit but cutting back Advise: needs to QUIT to reduce risk of cancer, cardio and cerebrovascular disease Assist: counseled for 5 minutes and literature provided Arrange: follow up in 3 months  

## 2019-01-12 NOTE — Assessment & Plan Note (Signed)
  Patient re-educated about  the importance of commitment to a  minimum of 150 minutes of exercise per week as able.  The importance of healthy food choices with portion control discussed, as well as eating regularly and within a 12 hour window most days. The need to choose "clean , green" food 50 to 75% of the time is discussed, as well as to make water the primary drink and set a goal of 64 ounces water daily.    Weight /BMI 01/08/2019 01/23/2018 01/10/2017  WEIGHT 178 lb 184 lb 1.3 oz 184 lb  HEIGHT 5\' 7"  5\' 7"  5\' 7"   BMI 27.88 kg/m2 28.83 kg/m2 28.82 kg/m2

## 2019-01-12 NOTE — Progress Notes (Signed)
   Laura Guerrero     MRN: [id number]      DOB: 07/18/1960   HPI Laura Guerrero is here with a 6 day h/o acute right leg pain from buttock to foot, this has occurred in the past. No inciting trauma known, she denies new weakness, numbness  Or incontinence ROS Denies recent fever or chills. Denies sinus pressure, nasal congestion, ear pain or sore throat. Denies chest congestion, productive cough or wheezing. Denies chest pains, palpitations and leg swelling Denies abdominal pain, nausea, vomiting,diarrhea or constipation.   Denies dysuria, frequency, hesitancy or incontinence.  Denies headaches, seizures, numbness, or tingling. Denies depression, anxiety or insomnia. Denies skin break down or rash.   PE  BP 128/80   Pulse 80   Temp (!) 96.9 F (36.1 C) (Temporal)   Resp 15   Ht 5\' 7"  (1.702 m)   Wt 178 lb (80.7 kg)   LMP 06/12/2011   BMI 27.88 kg/m   Patient alert and oriented and in no cardiopulmonary distress.Pt in pain  HEENT: No facial asymmetry, EOMI,     Neck supple .  Chest: Clear to auscultation bilaterally.  CVS: S1, S2 no murmurs, no S3.Regular rate.  ABD: Soft non tender.   Ext: No edema  MS: decreased  ROM lumbar  spine,  and right knee and localized tenderness over medial aspect right knee  Skin: Intact, no ulcerations or rash noted.  Psych: Good eye contact, normal affect. Memory intact not anxious or depressed appearing.  CNS: CN 2-12 intact, power,  normal throughout.no focal deficits noted.   Assessment & Plan  Knee pain, right 6 day history, localized tenderness posterio medical aspect Uncontrolled.Toradol and depo medrol administered IM in the office , to be followed by a short course of oral prednisone and NSAIDS. Anti inflammatory also prescribed per pt request to help with sleep, short term   NICOTINE ADDICTION Asked:confirms currently smokes cigarettes Assess: Unwilling to quit but cutting back Advise: needs to QUIT to reduce risk of  cancer, cardio and cerebrovascular disease Assist: counseled for 5 minutes and literature provided Arrange: follow up in 3 months   Overweight (BMI 25.0-29.9)  Patient re-educated about  the importance of commitment to a  minimum of 150 minutes of exercise per week as able.  The importance of healthy food choices with portion control discussed, as well as eating regularly and within a 12 hour window most days. The need to choose "clean , green" food 50 to 75% of the time is discussed, as well as to make water the primary drink and set a goal of 64 ounces water daily.    Weight /BMI 01/08/2019 01/23/2018 01/10/2017  WEIGHT 178 lb 184 lb 1.3 oz 184 lb  HEIGHT 5\' 7"  5\' 7"  5\' 7"   BMI [id number] kg/m2 28.83 kg/m2 28.82 kg/m2

## 2019-01-28 ENCOUNTER — Other Ambulatory Visit (HOSPITAL_COMMUNITY)
Admission: RE | Admit: 2019-01-28 | Discharge: 2019-01-28 | Disposition: A | Discharge status: Home / Self Care | Payer: 59 | Source: Ambulatory Visit | Attending: Family Medicine | Admitting: Family Medicine

## 2019-01-28 ENCOUNTER — Other Ambulatory Visit: Payer: Self-pay

## 2019-01-28 ENCOUNTER — Ambulatory Visit (HOSPITAL_COMMUNITY)
Admission: RE | Admit: 2019-01-28 | Discharge: 2019-01-28 | Disposition: A | Discharge status: Home / Self Care | Payer: 59 | Source: Ambulatory Visit | Attending: Family Medicine | Admitting: Family Medicine

## 2019-01-28 ENCOUNTER — Encounter: Payer: Self-pay | Admitting: Family Medicine

## 2019-01-28 ENCOUNTER — Ambulatory Visit (INDEPENDENT_AMBULATORY_CARE_PROVIDER_SITE_OTHER): Payer: 59 | Admitting: Family Medicine

## 2019-01-28 VITALS — BP 130/82 | HR 65 | Temp 97.6°F | Ht 67.0 in | Wt 176.0 lb

## 2019-01-28 DIAGNOSIS — Z0001 Encounter for general adult medical examination with abnormal findings: Secondary | ICD-10-CM | POA: Diagnosis not present

## 2019-01-28 DIAGNOSIS — M79661 Pain in right lower leg: Secondary | ICD-10-CM

## 2019-01-28 DIAGNOSIS — Z124 Encounter for screening for malignant neoplasm of cervix: Secondary | ICD-10-CM | POA: Diagnosis not present

## 2019-01-28 DIAGNOSIS — Z0000 Encounter for general adult medical examination without abnormal findings: Secondary | ICD-10-CM

## 2019-01-28 DIAGNOSIS — M549 Dorsalgia, unspecified: Secondary | ICD-10-CM

## 2019-01-28 DIAGNOSIS — E663 Overweight: Secondary | ICD-10-CM

## 2019-01-28 DIAGNOSIS — F17200 Nicotine dependence, unspecified, uncomplicated: Secondary | ICD-10-CM

## 2019-01-28 DIAGNOSIS — M79604 Pain in right leg: Secondary | ICD-10-CM

## 2019-01-28 MED ORDER — CALCIUM 1200 1200-1000 MG-UNIT PO CHEW: CHEWABLE_TABLET | ORAL | 3 refills | Status: DC

## 2019-01-28 NOTE — Assessment & Plan Note (Signed)
Asked:confirms currently smokes cigarettes Assess: Unwilling to quit but cutting back Advise: needs to QUIT to reduce risk of cancer, cardio and cerebrovascular disease Assist: counseled for 5 minutes and literature provided Arrange: follow up in 3 months  

## 2019-01-28 NOTE — Progress Notes (Signed)
    Laura Guerrero     MRN: [id number]      DOB: 03/21/1960  HPI: Patient is in for annual physical exam. Still c/o intermittent severe sharp pains in rigth legRecent labs, if available are reviewed. Immunization is reviewed , and  updated if needed.   PE: BP 130/82   Pulse 65   Temp 97.6 F (36.4 C) (Temporal)   Ht 5\' 7"  (1.702 m)   Wt 176 lb (79.8 kg)   LMP 06/12/2011   SpO2 98%   BMI 27.57 kg/m   Pleasant  female, alert and oriented x 3, in no cardio-pulmonary distress. Afebrile. HEENT No facial trauma or asymetry. Sinuses non tender.  Extra occullar muscles intact.. External ears normal, . Neck: supple, no adenopathy,JVD or thyromegaly.No bruits.  Chest: Clear to ascultation bilaterally.No crackles or wheezes. Non tender to palpation  Breast: No asymetry,no masses or lumps. No tenderness. No nipple discharge or inversion. No axillary or supraclavicular adenopathy  Cardiovascular system; Heart sounds normal,  S1 and  S2 ,no S3.  No murmur, or thrill. Apical beat not displaced Peripheral pulses normal.  Abdomen: Soft, non tender, no organomegaly or masses. No bruits. Bowel sounds normal. No guarding, tenderness or rebound.   GU: External genitalia normal female genitalia , normal female distribution of hair. No lesions. Urethral meatus normal in size, no  Prolapse, no lesions visibly  Present. Bladder non tender. Vagina pink and moist , with no visible lesions , discharge present . Adequate pelvic support no  cystocele or rectocele noted Cervix pink and appears healthy, no lesions or ulcerations noted, no discharge noted from os Uterus normal size, no adnexal masses, no cervical motion or adnexal tenderness.   Musculoskeletal exam: Full ROM of spine, hips , shoulders and knees. No deformity ,swelling or crepitus noted. No muscle wasting or atrophy.   Neurologic: Cranial nerves 2 to 12 intact. Power, tone ,sensation and reflexes normal throughout. No  disturbance in gait. No tremor.  Skin: Intact, no ulceration, erythema , scaling or rash noted. Pigmentation normal throughout  Psych; Normal mood and affect. Judgement and concentration normal   Assessment & Plan:  NICOTINE ADDICTION Asked:confirms currently smokes cigarettes Assess: Unwilling to quit but cutting back Advise: needs to QUIT to reduce risk of cancer, cardio and cerebrovascular disease Assist: counseled for 5 minutes and literature provided Arrange: follow up in 3 months   Annual physical exam Annual exam as documented. Counseling done  re healthy lifestyle involving commitment to 150 minutes exercise per week, heart healthy diet, and attaining healthy weight.The importance of adequate sleep also discussed. Regular seat belt use and home safety, is also discussed. Changes in health habits are decided on by the patient with goals and time frames  set for achieving them. Immunization and cancer screening needs are specifically addressed at this visit.   Right leg pain Persistent rihgt leg pain and swelling at times disabling, venous doppler to r/o clot  Back pain with radiation Clinical picture supports arthritis and disc disease causing rLE symptoms, will start with Xray of lumbar spine

## 2019-01-28 NOTE — Patient Instructions (Signed)
F/u in 5.5 months, call if you need me before  Please get [place] of right leg today and also X ray of low back, go to the X ray dept once you leave   Please reduce fat in diet and continue to work on weight loss  Once finished, no more once weekly vit D, instead take calcium with D once daily , s listed  Repeat non fast hepatic panel 1 week before f/u  Now smoking 10/day, please reconsider the need to stop and work on cutting back please  Thanks for choosing [facility], we consider it a privelige to serve you.

## 2019-01-29 ENCOUNTER — Telehealth: Payer: Self-pay

## 2019-01-29 DIAGNOSIS — M79604 Pain in right leg: Secondary | ICD-10-CM

## 2019-01-29 DIAGNOSIS — M549 Dorsalgia, unspecified: Secondary | ICD-10-CM

## 2019-01-29 DIAGNOSIS — M79605 Pain in left leg: Secondary | ICD-10-CM

## 2019-01-29 NOTE — Telephone Encounter (Signed)
Referral to ortho placed. Patient ok with seeing ortho in [place].

## 2019-02-02 ENCOUNTER — Encounter: Payer: Self-pay | Admitting: Family Medicine

## 2019-02-02 NOTE — Assessment & Plan Note (Signed)
Persistent rihgt leg pain and swelling at times disabling, venous doppler to r/o clot

## 2019-02-02 NOTE — Assessment & Plan Note (Signed)
Clinical picture supports arthritis and disc disease causing rLE symptoms, will start with Xray of lumbar spine

## 2019-02-02 NOTE — Assessment & Plan Note (Signed)

## 2019-02-03 LAB — CYTOLOGY - PAP
Comment: NEGATIVE
Diagnosis: NEGATIVE
High risk HPV: NEGATIVE

## 2019-02-05 ENCOUNTER — Encounter: Payer: Self-pay | Admitting: Family Medicine

## 2019-06-30 ENCOUNTER — Ambulatory Visit: Payer: 59 | Admitting: Family Medicine

## 2019-10-21 ENCOUNTER — Ambulatory Visit (INDEPENDENT_AMBULATORY_CARE_PROVIDER_SITE_OTHER): Payer: 59 | Admitting: Family Medicine

## 2019-10-21 ENCOUNTER — Encounter: Payer: Self-pay | Admitting: Family Medicine

## 2019-10-21 ENCOUNTER — Other Ambulatory Visit (HOSPITAL_COMMUNITY)
Admission: RE | Admit: 2019-10-21 | Discharge: 2019-10-21 | Disposition: A | Discharge status: Home / Self Care | Payer: 59 | Source: Ambulatory Visit | Attending: Family Medicine | Admitting: Family Medicine

## 2019-10-21 ENCOUNTER — Other Ambulatory Visit: Payer: Self-pay

## 2019-10-21 ENCOUNTER — Telehealth: Payer: Self-pay | Admitting: Family Medicine

## 2019-10-21 VITALS — BP 116/73 | HR 78 | Resp 16 | Ht 67.0 in | Wt 175.0 lb

## 2019-10-21 DIAGNOSIS — Z1231 Encounter for screening mammogram for malignant neoplasm of breast: Secondary | ICD-10-CM | POA: Diagnosis not present

## 2019-10-21 DIAGNOSIS — E663 Overweight: Secondary | ICD-10-CM | POA: Diagnosis not present

## 2019-10-21 DIAGNOSIS — M79675 Pain in left toe(s): Secondary | ICD-10-CM | POA: Diagnosis not present

## 2019-10-21 DIAGNOSIS — F17200 Nicotine dependence, unspecified, uncomplicated: Secondary | ICD-10-CM

## 2019-10-21 DIAGNOSIS — Z708 Other sex counseling: Secondary | ICD-10-CM | POA: Diagnosis not present

## 2019-10-21 DIAGNOSIS — N951 Menopausal and female climacteric states: Secondary | ICD-10-CM

## 2019-10-21 DIAGNOSIS — Z13220 Encounter for screening for lipoid disorders: Secondary | ICD-10-CM

## 2019-10-21 MED ORDER — CYCLOBENZAPRINE HCL 10 MG PO TABS: ORAL_TABLET | ORAL | 1 refills | Status: DC

## 2019-10-21 NOTE — Patient Instructions (Addendum)
Annual physical exam with MD early December, call if you need me before  Please schedule mammogram prior to leaving today  You are referred urgently to Podiatry we wil attempt to get appt info for you before you leave  ( cell [phone])  Work excuse for 8/11 and 8/12 to return 10/27/2019  Now smoking 6 ciggs/ day, see if you can cut back by 1 each month please  Self collected specimens from office today for BV,trich, Yeast, GC and Chlamydia    Steps to Quit Smoking Smoking tobacco is the leading cause of preventable death. It can affect almost every organ in the body. Smoking puts you and people around you at risk for many serious, long-lasting (chronic) diseases. Quitting smoking can be hard, but it is one of the best things that you can do for your health. It is never too late to quit. How do I get ready to quit? When you decide to quit smoking, make a plan to help you succeed. Before you quit:  Pick a date to quit. Set a date within the next 2 weeks to give you time to prepare.  Write down the reasons why you are quitting. Keep this list in places where you will see it often.  Tell your family, friends, and co-workers that you are quitting. Their support is important.  Talk with your doctor about the choices that may help you quit.  Find out if your health insurance will pay for these treatments.  Know the people, places, things, and activities that make you want to smoke (triggers). Avoid them. What first steps can I take to quit smoking?  Throw away all cigarettes at home, at work, and in your car.  Throw away the things that you use when you smoke, such as ashtrays and lighters.  Clean your car. Make sure to empty the ashtray.  Clean your home, including curtains and carpets. What can I do to help me quit smoking? Talk with your doctor about taking medicines and seeing a counselor at the same time. You are more likely to succeed when you do both.  If you are  pregnant or breastfeeding, talk with your doctor about counseling or other ways to quit smoking. Do not take medicine to help you quit smoking unless your doctor tells you to do so. To quit smoking: Quit right away  Quit smoking totally, instead of slowly cutting back on how much you smoke over a period of time.  Go to counseling. You are more likely to quit if you go to counseling sessions regularly. Take medicine You may take medicines to help you quit. Some medicines need a prescription, and some you can buy over-the-counter. Some medicines may contain a drug called nicotine to replace the nicotine in cigarettes. Medicines may:  Help you to stop having the desire to smoke (cravings).  Help to stop the problems that come when you stop smoking (withdrawal symptoms). Your doctor may ask you to use:  Nicotine patches, gum, or lozenges.  Nicotine inhalers or sprays.  Non-nicotine medicine that is taken by mouth. Find resources Find resources and other ways to help you quit smoking and remain smoke-free after you quit. These resources are most helpful when you use them often. They include:  Online chats with a Social worker.  Phone quitlines.  Printed Furniture conservator/restorer.  Support groups or group counseling.  Text messaging programs.  Mobile phone apps. Use apps on your mobile phone or tablet that can help you stick  to your quit plan. There are many free apps for mobile phones and tablets as well as websites. Examples include Quit Guide from the [employer] and smokefree.gov  What things can I do to make it easier to quit?   Talk to your family and friends. Ask them to support and encourage you.  Call a phone quitline (1-800-QUIT-NOW), reach out to support groups, or work with a Social worker.  Ask people who smoke to not smoke around you.  Avoid places that make you want to smoke, such as: ? Bars. ? Parties. ? Smoke-break areas at work.  Spend time with people who do not  smoke.  Lower the stress in your life. Stress can make you want to smoke. Try these things to help your stress: ? Getting regular exercise. ? Doing deep-breathing exercises. ? Doing yoga. ? Meditating. ? Doing a body scan. To do this, close your eyes, focus on one area of your body at a time from head to toe. Notice which parts of your body are tense. Try to relax the muscles in those areas. How will I feel when I quit smoking? Day 1 to 3 weeks Within the first 24 hours, you may start to have some problems that come from quitting tobacco. These problems are very bad 2-3 days after you quit, but they do not often last for more than 2-3 weeks. You may get these symptoms:  Mood swings.  Feeling restless, nervous, angry, or annoyed.  Trouble concentrating.  Dizziness.  Strong desire for high-sugar foods and nicotine.  Weight gain.  Trouble pooping (constipation).  Feeling like you may vomit (nausea).  Coughing or a sore throat.  Changes in how the medicines that you take for other issues work in your body.  Depression.  Trouble sleeping (insomnia). Week 3 and afterward After the first 2-3 weeks of quitting, you may start to notice more positive results, such as:  Better sense of smell and taste.  Less coughing and sore throat.  Slower heart rate.  Lower blood pressure.  Clearer skin.  Better breathing.  Fewer sick days. Quitting smoking can be hard. Do not give up if you fail the first time. Some people need to try a few times before they succeed. Do your best to stick to your quit plan, and talk with your doctor if you have any questions or concerns. Summary  Smoking tobacco is the leading cause of preventable death. Quitting smoking can be hard, but it is one of the best things that you can do for your health.  When you decide to quit smoking, make a plan to help you succeed.  Quit smoking right away, not slowly over a period of time.  When you start  quitting, seek help from your doctor, family, or friends. This information is not intended to replace advice given to you by your health care provider. Make sure you discuss any questions you have with your health care provider. Document Revised: 11/22/2018 Document Reviewed: 05/18/2018 Elsevier Patient Education  [employer].

## 2019-10-21 NOTE — Telephone Encounter (Signed)
Patient was only prescribed 10 tablets of cyclobenzaprine and stated during her visit you d/c'ed the vitamin D weekly. Do you want the patient to still be on these medications.

## 2019-10-21 NOTE — Telephone Encounter (Signed)
I spoke with the pt, flexeril is prescribed, needs OTC vit D 3, 1000 IU daily

## 2019-10-21 NOTE — Assessment & Plan Note (Signed)
Left job 09/07 due to uncontrolled left 3rd toe pain, difficulty wearing factory footwear, shoes and boots, and painful with standing

## 2019-10-21 NOTE — Telephone Encounter (Signed)
Pt needs a refill on Muscle relaxer and Vitamin D sent to [facility] in [place]. Please call back when refill is sent so pt can go pick up [phone]

## 2019-10-22 ENCOUNTER — Ambulatory Visit (INDEPENDENT_AMBULATORY_CARE_PROVIDER_SITE_OTHER): Payer: 59 | Admitting: Podiatry

## 2019-10-22 ENCOUNTER — Encounter: Payer: Self-pay | Admitting: Podiatry

## 2019-10-22 DIAGNOSIS — M205X2 Other deformities of toe(s) (acquired), left foot: Secondary | ICD-10-CM | POA: Diagnosis not present

## 2019-10-22 DIAGNOSIS — L84 Corns and callosities: Secondary | ICD-10-CM | POA: Diagnosis not present

## 2019-10-22 LAB — CBC
Hematocrit: 35.1 % (ref 34.0–46.6)
Hemoglobin: 11.5 g/dL (ref 11.1–15.9)
MCH: 28.8 pg (ref 26.6–33.0)
MCHC: 32.8 g/dL (ref 31.5–35.7)
MCV: 88 fL (ref 79–97)
Platelets: 184 10*3/uL (ref 150–450)
RBC: 3.99 x10E6/uL (ref 3.77–5.28)
RDW: 13.4 % (ref 11.7–15.4)
WBC: 4.1 10*3/uL (ref 3.4–10.8)

## 2019-10-22 LAB — CMP14+EGFR
ALT: 31 IU/L (ref 0–32)
AST: 31 IU/L (ref 0–40)
Albumin/Globulin Ratio: 1.8 (ref 1.2–2.2)
Albumin: 4.6 g/dL (ref 3.8–4.9)
Alkaline Phosphatase: 75 IU/L (ref 48–121)
BUN/Creatinine Ratio: 15 (ref 9–23)
BUN: 14 mg/dL (ref 6–24)
Bilirubin Total: 0.6 mg/dL (ref 0.0–1.2)
CO2: 25 mmol/L (ref 20–29)
Calcium: 9.7 mg/dL (ref 8.7–10.2)
Chloride: 104 mmol/L (ref 96–106)
Creatinine, Ser: 0.92 mg/dL (ref 0.57–1.00)
GFR calc Af Amer: 79 mL/min/{1.73_m2} (ref >59)
GFR calc non Af Amer: 68 mL/min/{1.73_m2} (ref >59)
Globulin, Total: 2.6 g/dL (ref 1.5–4.5)
Glucose: 90 mg/dL (ref 65–99)
Potassium: 4.6 mmol/L (ref 3.5–5.2)
Sodium: 142 mmol/L (ref 134–144)
Total Protein: 7.2 g/dL (ref 6.0–8.5)

## 2019-10-22 LAB — LIPID PANEL
Chol/HDL Ratio: 3.4 ratio (ref 0.0–4.4)
Cholesterol, Total: 217 mg/dL — ABNORMAL HIGH (ref 100–199)
HDL: 63 mg/dL (ref >39)
LDL Chol Calc (NIH): 133 mg/dL — ABNORMAL HIGH (ref 0–99)
Triglycerides: 117 mg/dL (ref 0–149)
VLDL Cholesterol Cal: 21 mg/dL (ref 5–40)

## 2019-10-22 LAB — CERVICOVAGINAL ANCILLARY ONLY
Bacterial Vaginitis (gardnerella): POSITIVE — AB
Candida Glabrata: NEGATIVE
Candida Vaginitis: NEGATIVE
Chlamydia: NEGATIVE
Comment: NEGATIVE
Comment: NEGATIVE
Comment: NEGATIVE
Comment: NEGATIVE
Comment: NEGATIVE
Comment: NORMAL
Neisseria Gonorrhea: NEGATIVE
Trichomonas: NEGATIVE

## 2019-10-22 NOTE — Progress Notes (Signed)
This patient presents to the office for evaluation of her third toe left foot.  She says her toe was painful and her medical doctor told her to be out of work until Monday.  She says she has painful callus on the tip of her third toe left foot.  She also has corn on the lateral aspect on the top of third toe.  She has used medication to apply to the corn on the top of third toe.  This toe is painful walking and wearing her shoes.  She presents for evaluation and treatment.  Vascular  Dorsalis pedis and posterior tibial pulses are palpable  B/L.  Capillary return  WNL.  Temperature gradient is  WNL.  Skin turgor  WNL  Sensorium  Senn Weinstein monofilament wire  WNL. Normal tactile sensation.  Nail Exam  Patient has normal nails with no evidence of bacterial or fungal infection.  Orthopedic  Exam  Muscle tone and muscle strength  WNL.  No limitations of motion feet  B/L.  No crepitus or joint effusion noted.  Foot type is unremarkable and digits show no abnormalities.  Dislocated DIPJ dorsally 3rd toe left foot.  Skin  No open lesions.  Normal skin texture and turgor. Distal clavi 3rd toe left.  Dorsal corn on the lateral aspect DIPJ 3rd toe left foot.  Clavi/corn due to dislocated DIPJ left foot  IE>  Discussed this condition with this patient.  Provided corn debridement with # 15 blade.  Patient admitted using acid on her dorsal corn third toe left foot.  Padding dispensed for third toe left foot.  Discussed surgical correction in future as needed.  Gardiner Barefoot DPM

## 2019-10-23 ENCOUNTER — Other Ambulatory Visit: Payer: Self-pay | Admitting: Family Medicine

## 2019-10-23 MED ORDER — METRONIDAZOLE 500 MG PO TABS: 500.0000 mg | ORAL_TABLET | Freq: Two times a day (BID) | ORAL | 0 refills | Status: DC

## 2019-10-25 ENCOUNTER — Encounter: Payer: Self-pay | Admitting: Family Medicine

## 2019-10-25 NOTE — Assessment & Plan Note (Signed)
Asked:confirms currently smokes cigarettes Assess: Unwilling to set a quit date, and is not cutting back Advise: needs to QUIT to reduce risk of cancer, cardio and cerebrovascular disease Assist: counseled for 5 minutes and literature provided Arrange: follow up in 2 to 4 months

## 2019-10-25 NOTE — Assessment & Plan Note (Signed)
  Patient re-educated about  the importance of commitment to a  minimum of 150 minutes of exercise per week as able.  The importance of healthy food choices with portion control discussed, as well as eating regularly and within a 12 hour window most days. The need to choose "clean , green" food 50 to 75% of the time is discussed, as well as to make water the primary drink and set a goal of 64 ounces water daily.    Weight /BMI 10/21/2019 01/28/2019 01/08/2019  WEIGHT 175 lb 0.6 oz 176 lb 178 lb  HEIGHT 5\' 7"  5\' 7"  5\' 7"   BMI 27.42 kg/m2 27.57 kg/m2 27.88 kg/m2

## 2019-10-25 NOTE — Progress Notes (Signed)
   Jacqulin Brandenburger     MRN: [id number]      DOB: 1960-04-21   HPI Ms. Atayde is here with a 3 week h/o left  toe pain, unable to weight bear and do her job as a result of callus under her 3rd toe  ROS Denies recent fever or chills. Denies sinus pressure, nasal congestion, ear pain or sore throat. Denies chest congestion, productive cough or wheezing. Denies chest pains, palpitations and leg swelling Denies abdominal pain, nausea, vomiting,diarrhea or constipation.   Denies dysuria, frequency, hesitancy or incontinence. . Denies headaches, seizures, numbness, or tingling. Denies depression, anxiety or insomnia. Denies skin break down or rash.   PE  BP 116/73   Pulse 78   Resp 16   Ht 5\' 7"  (1.702 m)   Wt 175 lb 0.6 oz (79.4 kg)   LMP 06/12/2011   BMI 27.42 kg/m   Patient alert and oriented and in no cardiopulmonary distress.  HEENT: No facial asymmetry, EOMI,     Neck supple .  Chest: Clear to auscultation bilaterally.  CVS: S1, S2 no murmurs, no S3.Regular rate.  ABD: Soft non tender.   Ext: No edema  MS: Adequate ROM spine, shoulders, hips and knees.  Skin: Intact, no ulcerations or rash noted.  Psych: Good eye contact, normal affect. Memory intact not anxious or depressed appearing.  CNS: CN 2-12 intact, power,  normal throughout.no focal deficits noted.   Assessment & Plan Toe pain, left Left job 09/07 due to uncontrolled left 3rd toe pain, difficulty wearing factory footwear, shoes and boots, and painful with standing  NICOTINE ADDICTION Asked:confirms currently smokes cigarettes Assess: Unwilling to set a quit date, and is not cutting back Advise: needs to QUIT to reduce risk of cancer, cardio and cerebrovascular disease Assist: counseled for 5 minutes and literature provided Arrange: follow up in 2 to 4 months   Overweight (BMI 25.0-29.9)  Patient re-educated about  the importance of commitment to a  minimum of 150 minutes of exercise per week as  able.  The importance of healthy food choices with portion control discussed, as well as eating regularly and within a 12 hour window most days. The need to choose "clean , green" food 50 to 75% of the time is discussed, as well as to make water the primary drink and set a goal of 64 ounces water daily.    Weight /BMI 10/21/2019 01/28/2019 01/08/2019  WEIGHT 175 lb 0.6 oz 176 lb 178 lb  HEIGHT 5\' 7"  5\' 7"  5\' 7"   BMI 27.42 kg/m2 27.57 kg/m2 27.88 kg/m2

## 2019-10-28 ENCOUNTER — Ambulatory Visit: Payer: 59 | Admitting: Family Medicine

## 2019-12-29 ENCOUNTER — Encounter: Payer: Self-pay | Admitting: Family Medicine

## 2019-12-29 ENCOUNTER — Ambulatory Visit (INDEPENDENT_AMBULATORY_CARE_PROVIDER_SITE_OTHER): Payer: 59 | Admitting: Family Medicine

## 2019-12-29 ENCOUNTER — Other Ambulatory Visit: Payer: Self-pay

## 2019-12-29 VITALS — BP 110/72 | HR 80 | Resp 16 | Ht 67.0 in | Wt 175.0 lb

## 2019-12-29 DIAGNOSIS — Z13220 Encounter for screening for lipoid disorders: Secondary | ICD-10-CM | POA: Diagnosis not present

## 2019-12-29 DIAGNOSIS — Z0000 Encounter for general adult medical examination without abnormal findings: Secondary | ICD-10-CM | POA: Diagnosis not present

## 2019-12-29 DIAGNOSIS — Z23 Encounter for immunization: Secondary | ICD-10-CM

## 2019-12-29 DIAGNOSIS — E663 Overweight: Secondary | ICD-10-CM

## 2019-12-29 DIAGNOSIS — E559 Vitamin D deficiency, unspecified: Secondary | ICD-10-CM

## 2019-12-29 DIAGNOSIS — Z131 Encounter for screening for diabetes mellitus: Secondary | ICD-10-CM | POA: Diagnosis not present

## 2019-12-29 DIAGNOSIS — F17200 Nicotine dependence, unspecified, uncomplicated: Secondary | ICD-10-CM | POA: Diagnosis not present

## 2019-12-29 LAB — POCT GLYCOSYLATED HEMOGLOBIN (HGB A1C): Hemoglobin A1C: 5.2 % (ref 4.0–5.6)

## 2019-12-29 NOTE — Patient Instructions (Addendum)
F/U in office with MD first week in April, call if you need me sooner Re evaluate smoking, weight and Cholesterol at that visit. Zostrix #1 at visit  Flu vaccine in office today  Glyco hB in office today, nurse to enter result on form and return form to you today before you leave  Please start seriously considering STOPPING or even WANTING tO STOP smoking cigarettes, you currently report smoking 6 to 7 per day   Fasting lipid, tSH and vit d end March   Health Risks of Smoking Smoking cigarettes is very bad for your health. Tobacco smoke has over 200 known poisons in it. It contains the poisonous gases nitrogen oxide and carbon monoxide. There are over 60 chemicals in tobacco smoke that cause cancer. Smoking is difficult to quit because a chemical in tobacco, called nicotine, causes addiction or dependence. When you smoke and inhale, nicotine is absorbed rapidly into the bloodstream through your lungs. Both inhaled and non-inhaled nicotine may be addictive. What are the risks of cigarette smoke? Cigarette smokers have an increased risk of many serious medical problems, including:  Lung cancer.  Lung disease, such as pneumonia, bronchitis, and emphysema.  Chest pain (angina) and heart attack because the heart is not getting enough oxygen.  Heart disease and peripheral blood vessel disease.  High blood pressure (hypertension).  Stroke.  Oral cancer, including cancer of the lip, mouth, or voice box.  Bladder cancer.  Pancreatic cancer.  Cervical cancer.  Pregnancy complications, including premature birth.  Stillbirths and smaller newborn babies, birth defects, and genetic damage to sperm.  Early menopause.  Lower estrogen level for women.  Infertility.  Facial wrinkles.  Blindness.  Increased risk of broken bones (fractures).  Senile dementia.  Stomach ulcers and internal bleeding.  Delayed wound healing and increased risk of complications during  surgery.  Even smoking lightly shortens your life expectancy by several years. Because of secondhand smoke exposure, children of smokers have an increased risk of the following:  Sudden infant death syndrome (SIDS).  Respiratory infections.  Lung cancer.  Heart disease.  Ear infections. What are the benefits of quitting? There are many health benefits of quitting smoking. Here are some of them:  Within days of quitting smoking, your risk of having a heart attack decreases, your blood flow improves, and your lung capacity improves. Blood pressure, pulse rate, and breathing patterns start returning to normal soon after quitting.  Within months, your lungs may clear up completely.  Quitting for 10 years reduces your risk of developing lung cancer and heart disease to almost that of a nonsmoker.  People who quit may see an improvement in their overall quality of life. How do I quit smoking?     Smoking is an addiction with both physical and psychological effects, and longtime habits can be hard to change. Your health care provider can recommend:  Programs and community resources, which may include group support, education, or talk therapy.  Prescription medicines to help reduce cravings.  Nicotine replacement products, such as patches, gum, and nasal sprays. Use these products only as directed. Do not replace cigarette smoking with electronic cigarettes, which are commonly called e-cigarettes. The safety of e-cigarettes is not known, and some may contain harmful chemicals.  A combination of two or more of these methods. Where to find more information  American Lung Association: www.lung.org  American Cancer Society: www.cancer.org Summary  Smoking cigarettes is very bad for your health. Cigarette smokers have an increased risk of many serious  medical problems, including several cancers, heart disease, and stroke.  Smoking is an addiction with both physical and psychological  effects, and longtime habits can be hard to change.  By stopping right away, you can greatly reduce the risk of medical problems for you and your family.  To help you quit smoking, your health care provider can recommend programs, community resources, prescription medicines, and nicotine replacement products such as patches, gum, and nasal sprays. This information is not intended to replace advice given to you by your health care provider. Make sure you discuss any questions you have with your health care provider. Document Revised: 05/31/2017 Document Reviewed: 03/03/2016 Elsevier Patient Education  2020 [employer].

## 2019-12-29 NOTE — Progress Notes (Signed)
    Laura Guerrero     MRN: [id number]      DOB: 05/02/1960  HPI: Patient is in for annual physical exam. No other health concerns are expressed or addressed at the visit. Recent labs, if available are reviewed. Immunization is reviewed , and  updated if needed.   PE: BP 110/72   Pulse 80   Resp 16   Ht 5\' 7"  (1.702 m)   Wt 175 lb (79.4 kg)   LMP 06/12/2011   BMI 27.41 kg/m   Pleasant  female, alert and oriented x 3, in no cardio-pulmonary distress. Afebrile. HEENT No facial trauma or asymetry. Sinuses non tender.  Extra occullar muscles intact.. External ears normal, . Neck: supple, no adenopathy,JVD or thyromegaly.No bruits.  Chest: Clear to ascultation bilaterally.No crackles or wheezes. Non tender to palpation  Breast: Asymptomatic, not examined ,. Normal mammogram in 2020   Cardiovascular system; Heart sounds normal,  S1 and  S2 ,no S3.  No murmur, or thrill. Apical beat not displaced Peripheral pulses normal.  Abdomen: Soft, non tender,   GU: Asymptomatic, not examined, pap is up to date,  Musculoskeletal exam: Full ROM of spine, hips , shoulders and knees. No deformity ,swelling or crepitus noted. No muscle wasting or atrophy.   Neurologic: Cranial nerves 2 to 12 intact. Power, tone ,sensation and reflexes normal throughout. No disturbance in gait. No tremor.  Skin: Intact, no ulceration, erythema , scaling or rash noted. Pigmentation normal throughout  Psych; Normal mood and affect. Judgement and concentration normal   Assessment & Plan:  Encounter for annual health examination Annual exam as documented. Counseling done  re healthy lifestyle involving commitment to 150 minutes exercise per week, heart healthy diet, and attaining healthy weight.The importance of adequate sleep also discussed. Regular seat belt use and home safety, is also discussed. Changes in health habits are decided on by the patient with goals and time frames  set for  achieving them. Immunization and cancer screening needs are specifically addressed at this visit.   NICOTINE ADDICTION Asked:confirms currently smokes cigarettes Assess: Unwilling to set a quit date, but is cutting back Advise: needs to QUIT to reduce risk of cancer, cardio and cerebrovascular disease Assist: counseled for 5 minutes and literature provided Arrange: follow up in 2 to 4 months

## 2019-12-31 NOTE — Assessment & Plan Note (Signed)
Asked:confirms currently smokes cigarettes °Assess: Unwilling to set a quit date, but is cutting back °Advise: needs to QUIT to reduce risk of cancer, cardio and cerebrovascular disease °Assist: counseled for 5 minutes and literature provided °Arrange: follow up in 2 to 4 months ° °

## 2019-12-31 NOTE — Assessment & Plan Note (Signed)
  Patient re-educated about  the importance of commitment to a  minimum of 150 minutes of exercise per week as able.  The importance of healthy food choices with portion control discussed, as well as eating regularly and within a 12 hour window most days. The need to choose "clean , green" food 50 to 75% of the time is discussed, as well as to make water the primary drink and set a goal of 64 ounces water daily.    Weight /BMI 12/29/2019 10/21/2019 01/28/2019  WEIGHT 175 lb 175 lb 0.6 oz 176 lb  HEIGHT 5\' 7"  5\' 7"  5\' 7"   BMI 27.41 kg/m2 27.42 kg/m2 27.57 kg/m2

## 2019-12-31 NOTE — Assessment & Plan Note (Signed)

## 2020-01-11 IMAGING — MG DIGITAL SCREENING BILATERAL MAMMOGRAM WITH TOMO AND CAD
9 series · 9 of 25 positions shown · non-contrast
Comparison: Previous exam(s).

ACR Breast Density Category a: The breast tissue is almost entirely
fatty.

CLINICAL DATA: Screening.

EXAM:
DIGITAL SCREENING BILATERAL MAMMOGRAM WITH TOMO AND CAD

[L MLO (1 of 2)]
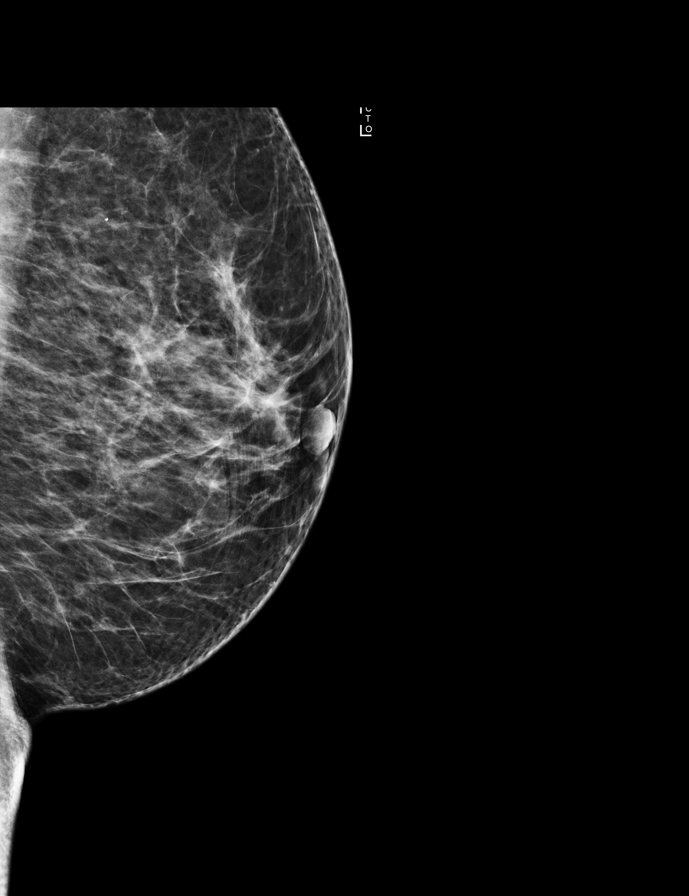

[R MLO]
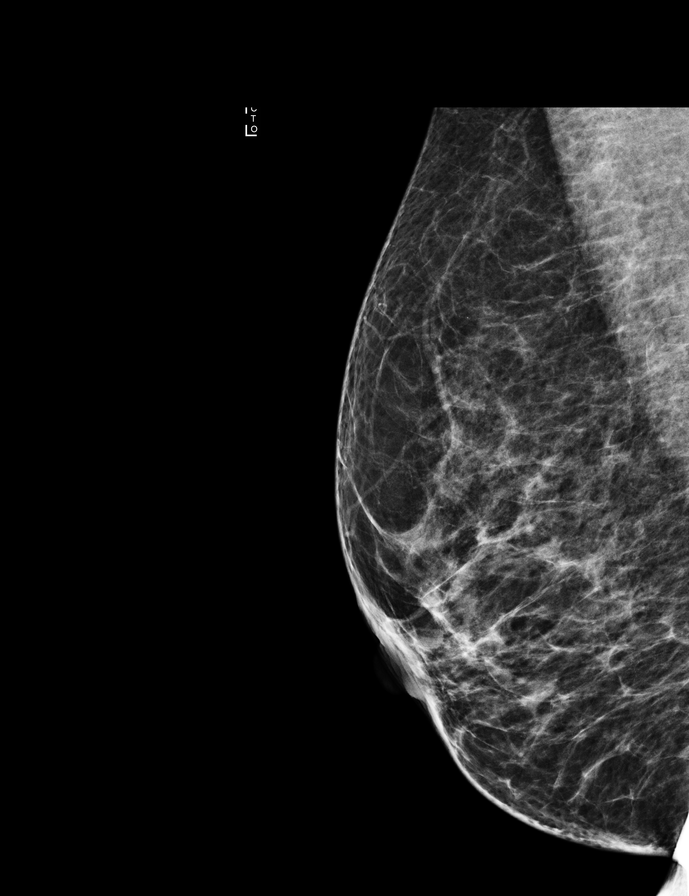

[L MLO (2 of 2)]
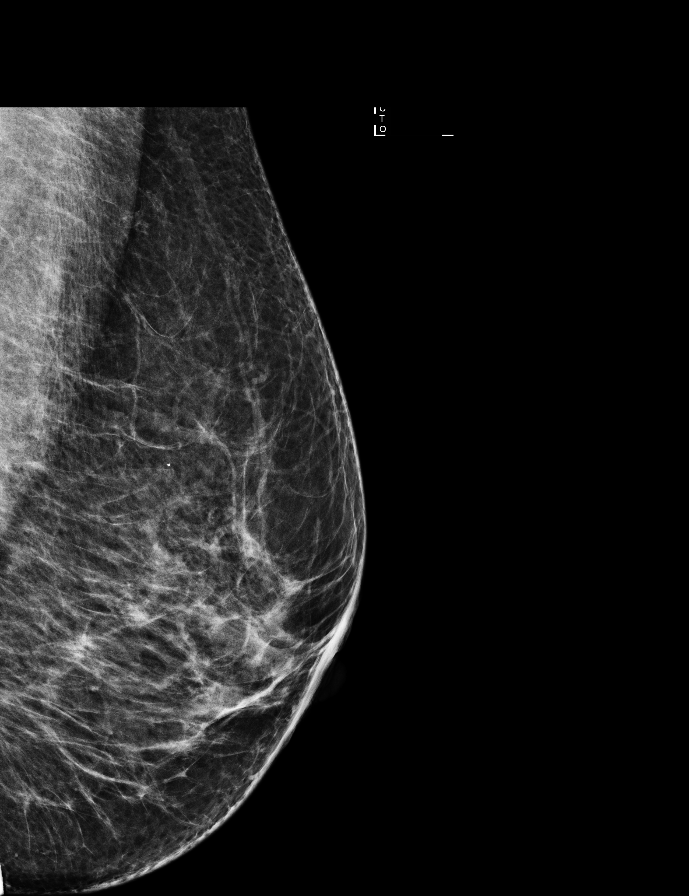

[L CC]
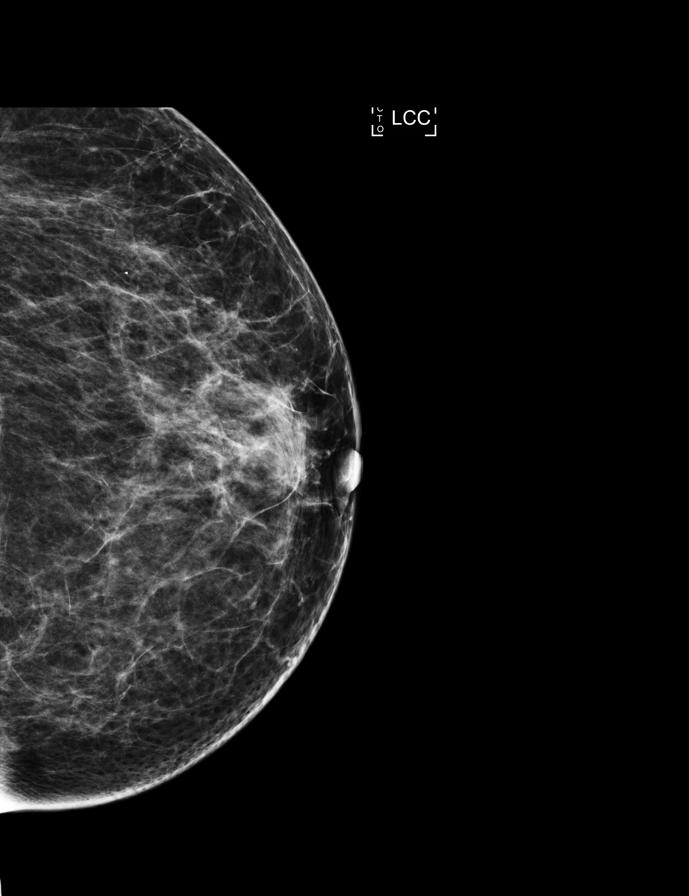

[R CC]
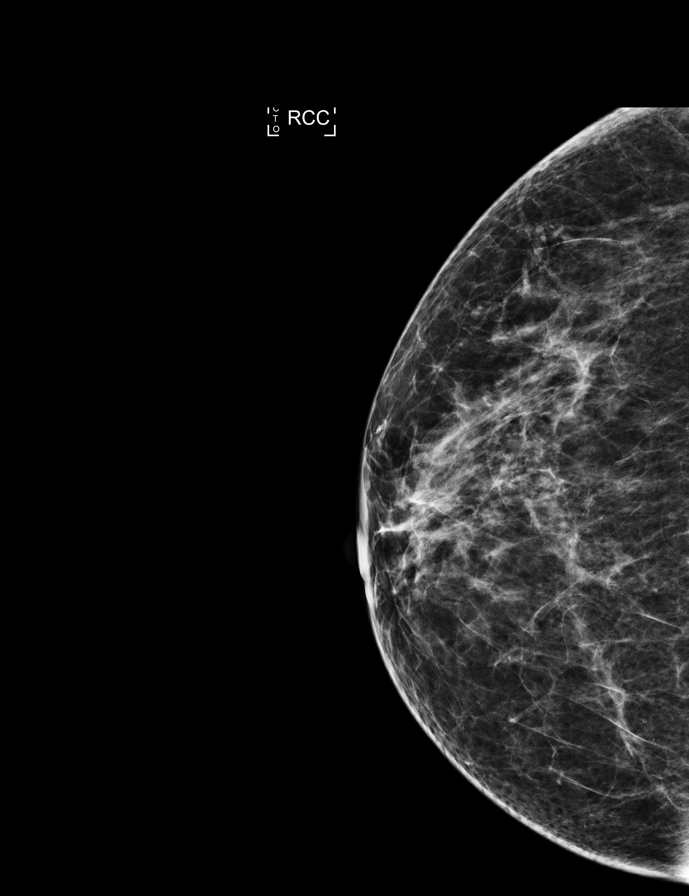

[L MLO tomo · tomo slice 35/69.0]
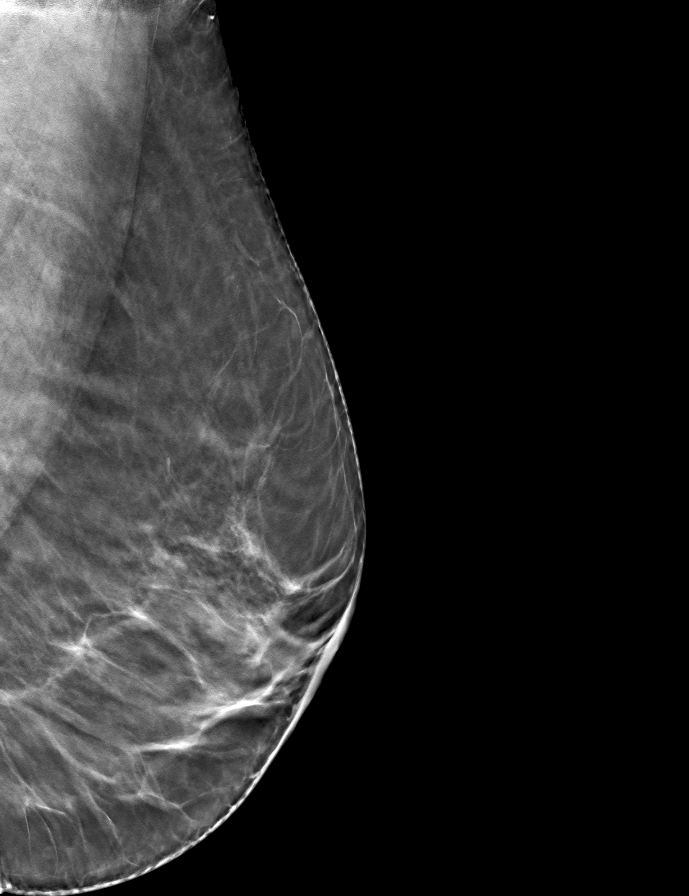

[R MLO tomo · tomo slice 31/60.0]
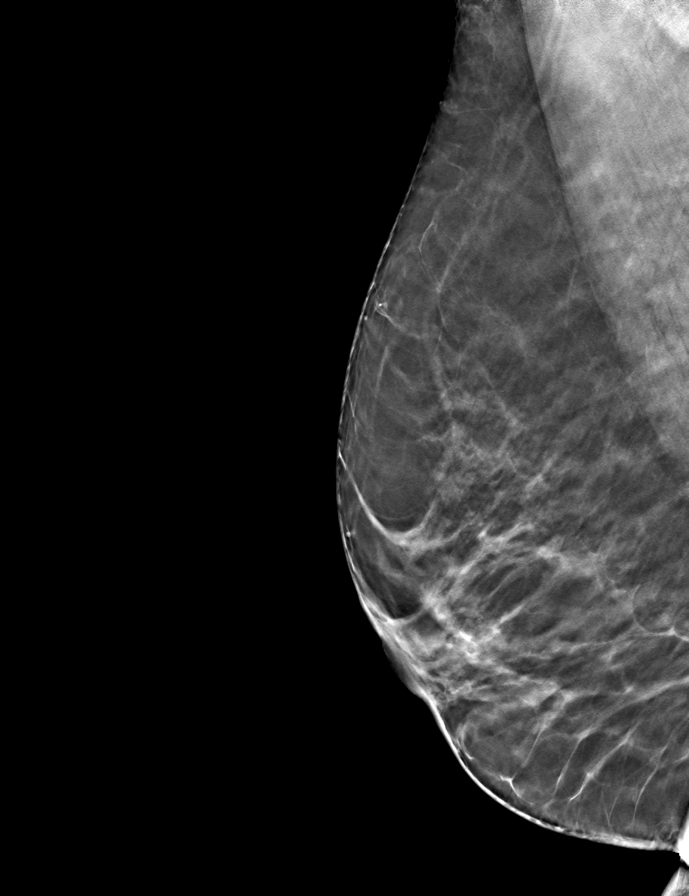

[L CC tomo · tomo slice 32/63.0]
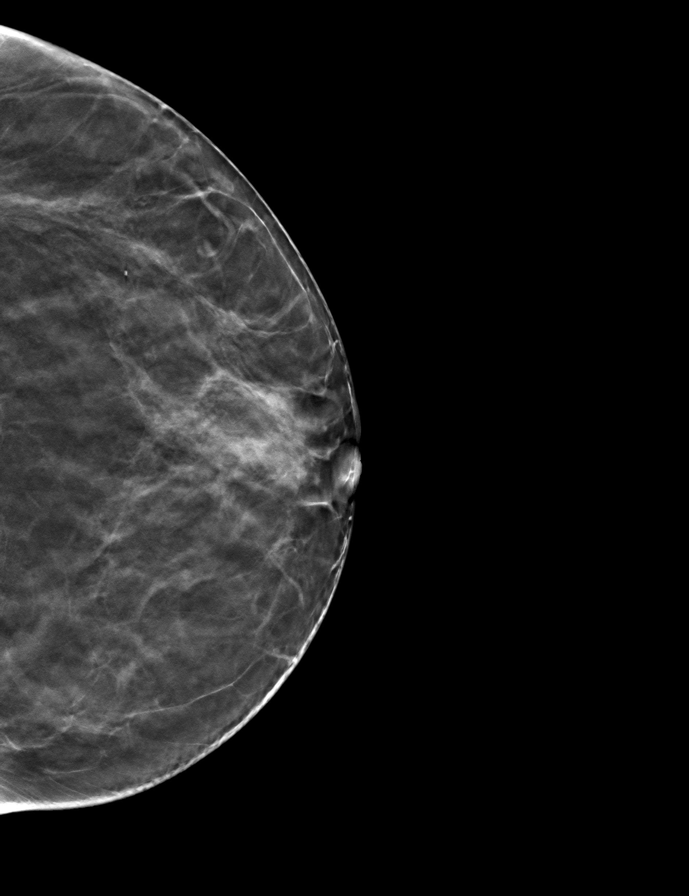

[R CC tomo · tomo slice 29/57.0]
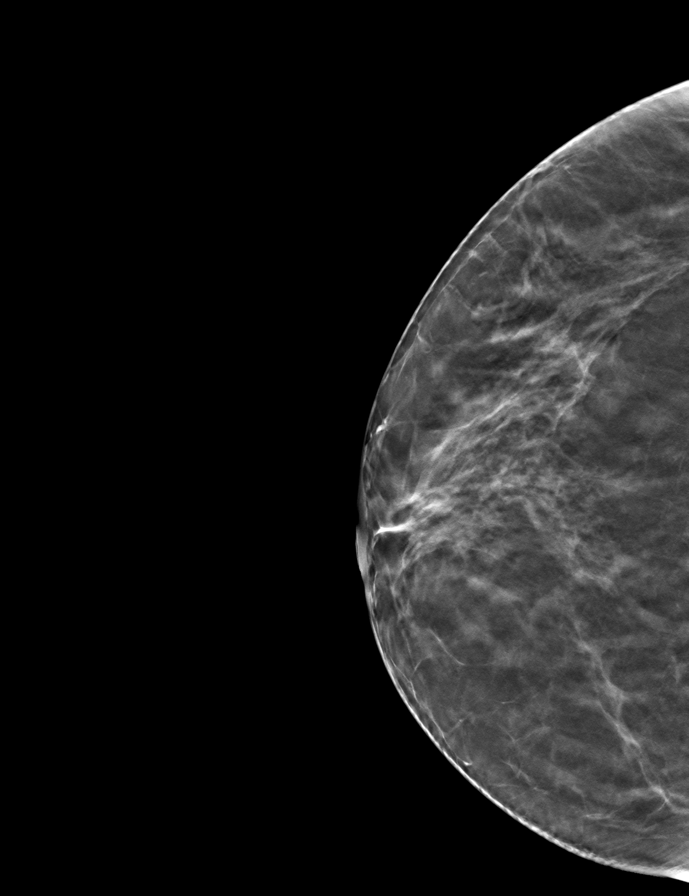

[9 of 25 positions shown; findings below may reference images not displayed]

FINDINGS: There are no findings suspicious for malignancy. Images were
processed with CAD.
IMPRESSION: No mammographic evidence of malignancy. A result letter of this
screening mammogram will be mailed directly to the patient.

RECOMMENDATION:
Screening mammogram in one year. (Code:[id number])

BI-RADS CATEGORY  1: Negative.

## 2020-01-20 ENCOUNTER — Other Ambulatory Visit: Payer: Self-pay | Admitting: Family Medicine

## 2020-02-09 ENCOUNTER — Other Ambulatory Visit: Payer: Self-pay | Admitting: Family Medicine

## 2020-07-29 LAB — LIPID PANEL
Chol/HDL Ratio: 3.3 ratio (ref 0.0–4.4)
Cholesterol, Total: 219 mg/dL — ABNORMAL HIGH (ref 100–199)
HDL: 66 mg/dL (ref >39)
LDL Chol Calc (NIH): 139 mg/dL — ABNORMAL HIGH (ref 0–99)
Triglycerides: 82 mg/dL (ref 0–149)
VLDL Cholesterol Cal: 14 mg/dL (ref 5–40)

## 2020-07-29 LAB — TSH: TSH: 0.509 u[IU]/mL (ref 0.450–4.500)

## 2020-07-29 LAB — VITAMIN D 25 HYDROXY (VIT D DEFICIENCY, FRACTURES): Vit D, 25-Hydroxy: 22.7 ng/mL — ABNORMAL LOW (ref 30.0–100.0)

## 2020-08-10 ENCOUNTER — Other Ambulatory Visit: Payer: Self-pay

## 2020-08-10 ENCOUNTER — Ambulatory Visit (INDEPENDENT_AMBULATORY_CARE_PROVIDER_SITE_OTHER): Payer: 59 | Admitting: Family Medicine

## 2020-08-10 ENCOUNTER — Encounter: Payer: Self-pay | Admitting: Family Medicine

## 2020-08-10 VITALS — BP 112/73 | Resp 16 | Ht 67.0 in | Wt 177.0 lb

## 2020-08-10 DIAGNOSIS — F17200 Nicotine dependence, unspecified, uncomplicated: Secondary | ICD-10-CM

## 2020-08-10 DIAGNOSIS — Z13220 Encounter for screening for lipoid disorders: Secondary | ICD-10-CM

## 2020-08-10 DIAGNOSIS — N951 Menopausal and female climacteric states: Secondary | ICD-10-CM

## 2020-08-10 DIAGNOSIS — N898 Other specified noninflammatory disorders of vagina: Secondary | ICD-10-CM

## 2020-08-10 DIAGNOSIS — Z131 Encounter for screening for diabetes mellitus: Secondary | ICD-10-CM

## 2020-08-10 DIAGNOSIS — E663 Overweight: Secondary | ICD-10-CM

## 2020-08-10 DIAGNOSIS — Z1231 Encounter for screening mammogram for malignant neoplasm of breast: Secondary | ICD-10-CM

## 2020-08-10 MED ORDER — METRONIDAZOLE 500 MG PO TABS: 500.0000 mg | ORAL_TABLET | Freq: Two times a day (BID) | ORAL | 0 refills | Status: DC

## 2020-08-10 NOTE — Progress Notes (Signed)
   Laura Guerrero     MRN: [id number]      DOB: Oct 13, 1960   HPI Laura Guerrero is here for follow up and re-evaluation of chronic medical conditions, medication management and review of any available recent lab and radiology data.  Preventive health is updated, specifically  Cancer screening and Immunization.   Questions or concerns regarding consultations or procedures which the PT has had in the interim are  addressed. The PT denies any adverse reactions to current medications since the last visit.  1 week h/o fishyodor ROS Denies recent fever or chills. Denies sinus pressure, nasal congestion, ear pain or sore throat. Denies chest congestion, productive cough or wheezing. Denies chest pains, palpitations and leg swelling Denies abdominal pain, nausea, vomiting,diarrhea or constipation.   Denies dysuria, frequency, hesitancy or incontinence. Denies joint pain, swelling and limitation in mobility. Denies headaches, seizures, numbness, or tingling. Denies depression, anxiety or insomnia. Denies skin break down or rash.   PE  BP 112/73   Resp 16   Ht 5\' 7"  (1.702 m)   Wt 177 lb (80.3 kg)   LMP 06/12/2011   BMI 27.72 kg/m   Patient alert and oriented and in no cardiopulmonary distress.  HEENT: No facial asymmetry, EOMI,     Neck supple .  Chest: Clear to auscultation bilaterally.  CVS: S1, S2 no murmurs, no S3.Regular rate.  ABD: Soft non tender.   Ext: No edema  MS: Adequate ROM spine, shoulders, hips and knees.  Skin: Intact, no ulcerations or rash noted.  Psych: Good eye contact, normal affect. Memory intact not anxious or depressed appearing.  CNS: CN 2-12 intact, power,  normal throughout.no focal deficits noted.   Assessment & Plan  NICOTINE ADDICTION Asked:confirms currently smokes cigarettes Assess: Unwilling to set a quit date, but is cutting back Advise: needs to QUIT to reduce risk of cancer, cardio and cerebrovascular disease Assist: counseled for 5  minutes and literature provided Arrange: follow up in 2 to 4 months   Overweight (BMI 25.0-29.9)  Patient re-educated about  the importance of commitment to a  minimum of 150 minutes of exercise per week as able.  The importance of healthy food choices with portion control discussed, as well as eating regularly and within a 12 hour window most days. The need to choose "clean , green" food 50 to 75% of the time is discussed, as well as to make water the primary drink and set a goal of 64 ounces water daily.    Weight /BMI 08/10/2020 12/29/2019 10/21/2019  WEIGHT 177 lb 175 lb 175 lb 0.6 oz  HEIGHT 5\' 7"  5\' 7"  5\' 7"   BMI 27.72 kg/m2 27.41 kg/m2 27.42 kg/m2      Vaginal discharge Recurrent fishy odor, not sexually active currently, has h/o recurrent Bv , will treat presumptively

## 2020-08-10 NOTE — Patient Instructions (Addendum)
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

## 2020-08-17 ENCOUNTER — Encounter: Payer: Self-pay | Admitting: Family Medicine

## 2020-08-17 DIAGNOSIS — N898 Other specified noninflammatory disorders of vagina: Secondary | ICD-10-CM | POA: Insufficient documentation

## 2020-08-17 NOTE — Assessment & Plan Note (Signed)
Recurrent fishy odor, not sexually active currently, has h/o recurrent Bv , will treat presumptively

## 2020-08-17 NOTE — Assessment & Plan Note (Signed)
  Patient re-educated about  the importance of commitment to a  minimum of 150 minutes of exercise per week as able.  The importance of healthy food choices with portion control discussed, as well as eating regularly and within a 12 hour window most days. The need to choose "clean , green" food 50 to 75% of the time is discussed, as well as to make water the primary drink and set a goal of 64 ounces water daily.    Weight /BMI 08/10/2020 12/29/2019 10/21/2019  WEIGHT 177 lb 175 lb 175 lb 0.6 oz  HEIGHT 5\' 7"  5\' 7"  5\' 7"   BMI 27.72 kg/m2 27.41 kg/m2 27.42 kg/m2

## 2020-08-17 NOTE — Assessment & Plan Note (Signed)
Asked:confirms currently smokes cigarettes °Assess: Unwilling to set a quit date, but is cutting back °Advise: needs to QUIT to reduce risk of cancer, cardio and cerebrovascular disease °Assist: counseled for 5 minutes and literature provided °Arrange: follow up in 2 to 4 months ° °

## 2020-08-18 ENCOUNTER — Ambulatory Visit (HOSPITAL_COMMUNITY): Payer: 59

## 2020-08-18 ENCOUNTER — Ambulatory Visit (HOSPITAL_COMMUNITY)
Admission: RE | Admit: 2020-08-18 | Discharge: 2020-08-18 | Disposition: A | Discharge status: Home / Self Care | Payer: 59 | Source: Ambulatory Visit | Attending: Family Medicine | Admitting: Family Medicine

## 2020-08-18 ENCOUNTER — Other Ambulatory Visit: Payer: Self-pay

## 2020-08-18 DIAGNOSIS — Z1231 Encounter for screening mammogram for malignant neoplasm of breast: Secondary | ICD-10-CM | POA: Diagnosis not present

## 2020-09-02 ENCOUNTER — Ambulatory Visit (HOSPITAL_COMMUNITY): Admission: RE | Admit: 2020-09-02 | Payer: 59 | Source: Ambulatory Visit

## 2020-09-29 ENCOUNTER — Ambulatory Visit (HOSPITAL_COMMUNITY)
Admission: RE | Admit: 2020-09-29 | Discharge: 2020-09-29 | Disposition: A | Discharge status: Home / Self Care | Payer: 59 | Source: Ambulatory Visit | Attending: Family Medicine | Admitting: Family Medicine

## 2020-09-29 ENCOUNTER — Other Ambulatory Visit: Payer: Self-pay

## 2020-09-29 DIAGNOSIS — F17200 Nicotine dependence, unspecified, uncomplicated: Secondary | ICD-10-CM | POA: Diagnosis present

## 2020-10-21 ENCOUNTER — Encounter: Payer: Self-pay | Admitting: Internal Medicine

## 2020-10-21 ENCOUNTER — Ambulatory Visit (INDEPENDENT_AMBULATORY_CARE_PROVIDER_SITE_OTHER): Payer: 59 | Admitting: Internal Medicine

## 2020-10-21 ENCOUNTER — Other Ambulatory Visit: Payer: Self-pay

## 2020-10-21 VITALS — BP 125/80 | HR 64 | Temp 97.5°F | Resp 18 | Ht 67.0 in | Wt 183.0 lb

## 2020-10-21 DIAGNOSIS — L309 Dermatitis, unspecified: Secondary | ICD-10-CM | POA: Diagnosis not present

## 2020-10-21 DIAGNOSIS — L509 Urticaria, unspecified: Secondary | ICD-10-CM

## 2020-10-21 MED ORDER — HYDROXYZINE PAMOATE 25 MG PO CAPS: 25.0000 mg | ORAL_CAPSULE | Freq: Three times a day (TID) | ORAL | 0 refills | Status: DC | PRN

## 2020-10-21 MED ORDER — TRIAMCINOLONE ACETONIDE 0.1 % EX CREA: 1.0000 "application " | TOPICAL_CREAM | Freq: Two times a day (BID) | CUTANEOUS | 0 refills | Status: DC

## 2020-10-21 NOTE — Progress Notes (Signed)
Acute Office Visit  Subjective:    Patient ID: Laura Guerrero, female    DOB: [birth year], 60 y.o.   MRN: [id number]  Chief Complaint  Patient presents with   Rash    Pt broke out in rash on Tuesday 10-19-20 just on arms and hands itches and tender had some prednisone left over and tried this this helped     HPI Patient is in today for evaluation of a rash on her b/l arm and right hand for about 2 days. She reports using a new soap recently. Denies any injury recently. Denies any insect bite. Denies any fever, chills, or skin breakdown.  Past Medical History:  Diagnosis Date   Fracture of finger, middle phalanx, right, closed 03/2012   no surgical intervention    History reviewed. No pertinent surgical history.  Family History  Problem Relation Age of Onset   Diabetes Mother    Hypertension Father    Hypertension Sister    Stroke Sister    Heart disease Maternal Grandmother     Social History   Socioeconomic History   Marital status: Single    Spouse name: Not on file   Number of children: Not on file   Years of education: Not on file   Highest education level: Not on file  Occupational History   Not on file  Tobacco Use   Smoking status: Every Day    Packs/day: 0.50    Types: Cigarettes   Smokeless tobacco: Never  Substance and Sexual Activity   Alcohol use: No   Drug use: No   Sexual activity: Not on file  Other Topics Concern   Not on file  Social History Narrative   Not on file   Social Determinants of Health   Financial Resource Strain: Not on file  Food Insecurity: Not on file  Transportation Needs: Not on file  Physical Activity: Not on file  Stress: Not on file  Social Connections: Not on file  Intimate Partner Violence: Not on file    Outpatient Medications Prior to Visit  Medication Sig Dispense Refill   Calcium Carbonate-Vit D-Min (CALCIUM 1200) 1200-1000 MG-UNIT CHEW Take one tablet by mouth once daily 100 tablet 3   Multiple Vitamin  (MULTIVITAMIN) tablet Take 1 tablet by mouth as needed.     Vitamin D, Ergocalciferol, (DRISDOL) 1.25 MG (50000 UNIT) CAPS capsule Take 50,000 Units by mouth every 7 (seven) days.     metroNIDAZOLE (FLAGYL) 500 MG tablet Take 1 tablet (500 mg total) by mouth 2 (two) times daily. (Patient not taking: Reported on 10/21/2020) 14 tablet 0   No facility-administered medications prior to visit.    Allergies  Allergen Reactions   Ibuprofen Dermatitis    Review of Systems  Constitutional:  Negative for chills and fever.  HENT:  Negative for congestion, sinus pressure, sinus pain and sore throat.   Eyes:  Negative for pain and discharge.  Respiratory:  Negative for cough and shortness of breath.   Cardiovascular:  Negative for chest pain and palpitations.  Gastrointestinal:  Negative for abdominal pain, constipation, diarrhea, nausea and vomiting.  Endocrine: Negative for polydipsia and polyuria.  Genitourinary:  Negative for dysuria and hematuria.  Musculoskeletal:  Negative for neck pain and neck stiffness.  Skin:  Positive for rash.  Neurological:  Negative for dizziness and weakness.  Psychiatric/Behavioral:  Negative for agitation and behavioral problems.       Objective:    Physical Exam Vitals reviewed.  Constitutional:  General: She is not in acute distress.    Appearance: She is not diaphoretic.  HENT:     Head: Normocephalic and atraumatic.  Eyes:     General: No scleral icterus.    Extraocular Movements: Extraocular movements intact.  Cardiovascular:     Rate and Rhythm: Normal rate and regular rhythm.     Pulses: Normal pulses.     Heart sounds: No murmur heard. Pulmonary:     Breath sounds: Normal breath sounds. No wheezing or rales.  Musculoskeletal:     Cervical back: Neck supple. No tenderness.  Skin:    General: Skin is warm.     Findings: Rash (Small erythematous maculopapular rash over b/l arms) present.  Neurological:     General: No focal deficit  present.     Mental Status: She is alert and oriented to person, place, and time.  Psychiatric:        Mood and Affect: Mood normal.        Behavior: Behavior normal.    BP 125/80 (BP Location: Left Arm, Patient Position: Sitting, Cuff Size: Normal)   Pulse 64   Temp (!) 97.5 F (36.4 C) (Oral)   Resp 18   Ht '5\' 7"'$  (1.702 m)   Wt 183 lb (83 kg)   LMP 06/12/2011   SpO2 98%   BMI 28.66 kg/m  Wt Readings from Last 3 Encounters:  10/21/20 183 lb (83 kg)  08/10/20 177 lb (80.3 kg)  12/29/19 175 lb (79.4 kg)    Health Maintenance Due  Topic Date Due   Pneumococcal Vaccine 44-69 Years old (1 - PCV) Never done   COVID-19 Vaccine (3 - Booster for Moderna series) 10/23/2019   INFLUENZA VACCINE  10/11/2020    There are no preventive care reminders to display for this patient.   Lab Results  Component Value Date   TSH 0.509 07/28/2020   Lab Results  Component Value Date   WBC 4.1 10/21/2019   HGB 11.5 10/21/2019   HCT 35.1 10/21/2019   MCV 88 10/21/2019   PLT 184 10/21/2019   Lab Results  Component Value Date   NA 142 10/21/2019   K 4.6 10/21/2019   CO2 25 10/21/2019   GLUCOSE 90 10/21/2019   BUN 14 10/21/2019   CREATININE 0.92 10/21/2019   BILITOT 0.6 10/21/2019   ALKPHOS 75 10/21/2019   AST 31 10/21/2019   ALT 31 10/21/2019   PROT 7.2 10/21/2019   ALBUMIN 4.6 10/21/2019   CALCIUM 9.7 10/21/2019   Lab Results  Component Value Date   CHOL 219 (H) 07/28/2020   Lab Results  Component Value Date   HDL 66 07/28/2020   Lab Results  Component Value Date   LDLCALC 139 (H) 07/28/2020   Lab Results  Component Value Date   TRIG 82 07/28/2020   Lab Results  Component Value Date   CHOLHDL 3.3 07/28/2020   Lab Results  Component Value Date   HGBA1C 5.2 12/29/2019       Assessment & Plan:   Problem List Items Addressed This Visit    Visit Diagnoses     Urticaria    -  Primary Likely due to allergic reaction to new soap, advised to change  soap Atarax PRN for itching Kenalog for local relief If persistent, will do a short course of steroid   Relevant Medications   hydrOXYzine (VISTARIL) 25 MG capsule   Eczema, unspecified type       Relevant Medications   hydrOXYzine (  VISTARIL) 25 MG capsule   triamcinolone cream (KENALOG) 0.1 %        Meds ordered this encounter  Medications   hydrOXYzine (VISTARIL) 25 MG capsule    Sig: Take 1 capsule (25 mg total) by mouth every 8 (eight) hours as needed.    Dispense:  30 capsule    Refill:  0   triamcinolone cream (KENALOG) 0.1 %    Sig: Apply 1 application topically 2 (two) times daily.    Dispense:  30 g    Refill:  0     Dylin Ihnen Keith Rake, MD

## 2021-01-18 ENCOUNTER — Encounter: Payer: 59 | Admitting: Family Medicine

## 2021-01-20 ENCOUNTER — Ambulatory Visit (INDEPENDENT_AMBULATORY_CARE_PROVIDER_SITE_OTHER): Payer: 59 | Admitting: Family Medicine

## 2021-01-20 ENCOUNTER — Encounter: Payer: Self-pay | Admitting: Family Medicine

## 2021-01-20 ENCOUNTER — Other Ambulatory Visit: Payer: Self-pay

## 2021-01-20 VITALS — BP 134/84 | HR 82 | Resp 16 | Ht 67.0 in | Wt 182.0 lb

## 2021-01-20 DIAGNOSIS — Z13220 Encounter for screening for lipoid disorders: Secondary | ICD-10-CM | POA: Diagnosis not present

## 2021-01-20 DIAGNOSIS — F17200 Nicotine dependence, unspecified, uncomplicated: Secondary | ICD-10-CM

## 2021-01-20 DIAGNOSIS — Z23 Encounter for immunization: Secondary | ICD-10-CM | POA: Diagnosis not present

## 2021-01-20 DIAGNOSIS — Z0000 Encounter for general adult medical examination without abnormal findings: Secondary | ICD-10-CM | POA: Diagnosis not present

## 2021-01-20 DIAGNOSIS — E559 Vitamin D deficiency, unspecified: Secondary | ICD-10-CM

## 2021-01-20 DIAGNOSIS — E663 Overweight: Secondary | ICD-10-CM

## 2021-01-20 NOTE — Patient Instructions (Addendum)
F/U in 6 months, re evl smoking and shingrix #1   Flu vaccine today  Need covid booster , please get this  5 ciggs/ day starting in January, keep reducing with a plan to quit!   Lipid, CBC, chem 7 and eGFR and vit D today  It is important that you exercise regularly at least 30 minutes 5 times a week. If you develop chest pain, have severe difficulty breathing, or feel very tired, stop exercising immediately and seek medical attention   Thanks for choosing [facility], we consider it a privelige to serve you.

## 2021-01-22 ENCOUNTER — Encounter: Payer: Self-pay | Admitting: Family Medicine

## 2021-01-22 LAB — VITAMIN D 25 HYDROXY (VIT D DEFICIENCY, FRACTURES): Vit D, 25-Hydroxy: 31.8 ng/mL (ref 30.0–100.0)

## 2021-01-22 LAB — BMP8+EGFR
BUN/Creatinine Ratio: 16 (ref 12–28)
BUN: 12 mg/dL (ref 8–27)
CO2: 19 mmol/L — ABNORMAL LOW (ref 20–29)
Calcium: 9.9 mg/dL (ref 8.7–10.3)
Chloride: 104 mmol/L (ref 96–106)
Creatinine, Ser: 0.76 mg/dL (ref 0.57–1.00)
Glucose: 73 mg/dL (ref 70–99)
Potassium: 4.3 mmol/L (ref 3.5–5.2)
Sodium: 143 mmol/L (ref 134–144)
eGFR: 89 mL/min/{1.73_m2} (ref >59)

## 2021-01-22 LAB — CBC
Hematocrit: 36 % (ref 34.0–46.6)
Hemoglobin: 12 g/dL (ref 11.1–15.9)
MCH: 29.4 pg (ref 26.6–33.0)
MCHC: 33.3 g/dL (ref 31.5–35.7)
MCV: 88 fL (ref 79–97)
Platelets: 250 10*3/uL (ref 150–450)
RBC: 4.08 x10E6/uL (ref 3.77–5.28)
RDW: 13.2 % (ref 11.7–15.4)
WBC: 4.8 10*3/uL (ref 3.4–10.8)

## 2021-01-22 LAB — LIPID PANEL
Chol/HDL Ratio: 3.8 ratio (ref 0.0–4.4)
Cholesterol, Total: 230 mg/dL — ABNORMAL HIGH (ref 100–199)
HDL: 61 mg/dL (ref >39)
LDL Chol Calc (NIH): 144 mg/dL — ABNORMAL HIGH (ref 0–99)
Triglycerides: 139 mg/dL (ref 0–149)
VLDL Cholesterol Cal: 25 mg/dL (ref 5–40)

## 2021-01-22 NOTE — Assessment & Plan Note (Signed)
Asked:confirms currently smokes cigarettes °Assess: Unwilling to set a quit date, but is cutting back °Advise: needs to QUIT to reduce risk of cancer, cardio and cerebrovascular disease °Assist: counseled for 5 minutes and literature provided °Arrange: follow up in 2 to 4 months ° °

## 2021-01-22 NOTE — Assessment & Plan Note (Signed)

## 2021-01-22 NOTE — Progress Notes (Signed)
    Laura Guerrero     MRN: [id number]      DOB: 1960/07/12  HPI: Patient is in for annual physical exam. Immunization is reviewed , and  updated if needed.   PE: BP 134/84   Pulse 82   Resp 16   Ht 5\' 7"  (1.702 m)   Wt 182 lb (82.6 kg)   LMP 06/12/2011   SpO2 97%   BMI 28.51 kg/m   Pleasant  female, alert and oriented x 3, in no cardio-pulmonary distress. Afebrile. HEENT No facial trauma or asymetry. Sinuses non tender.  Extra occullar muscles intact.. External ears normal, . Neck: supple, no adenopathy,JVD or thyromegaly.No bruits.  Chest: Clear to ascultation bilaterally.No crackles or wheezes. Non tender to palpation   Cardiovascular system; Heart sounds normal,  S1 and  S2 ,no S3.  No murmur, or thrill. Apical beat not displaced Peripheral pulses normal.  Abdomen: Soft, non tender, no organomegaly or masses. No bruits. Bowel sounds normal. No guarding, tenderness or rebound.     Musculoskeletal exam: Full ROM of spine, hips , shoulders and knees. No deformity ,swelling or crepitus noted. No muscle wasting or atrophy.   Neurologic: Cranial nerves 2 to 12 intact. Power, tone ,sensation and reflexes normal throughout. No disturbance in gait. No tremor.  Skin: Intact, no ulceration, erythema , scaling or rash noted. Pigmentation normal throughout  Psych; Normal mood and affect. Judgement and concentration normal   Assessment & Plan:  Annual physical exam Annual exam as documented. Counseling done  re healthy lifestyle involving commitment to 150 minutes exercise per week, heart healthy diet, and attaining healthy weight.The importance of adequate sleep also discussed. Regular seat belt use and home safety, is also discussed. Changes in health habits are decided on by the patient with goals and time frames  set for achieving them. Immunization and cancer screening needs are specifically addressed at this visit.   NICOTINE  ADDICTION Asked:confirms currently smokes cigarettes Assess: Unwilling to set a quit date, but is cutting back Advise: needs to QUIT to reduce risk of cancer, cardio and cerebrovascular disease Assist: counseled for 5 minutes and literature provided Arrange: follow up in 2 to 4 months

## 2021-02-22 ENCOUNTER — Telehealth: Payer: Self-pay | Admitting: Family Medicine

## 2021-02-22 NOTE — Telephone Encounter (Signed)
FMLA FORMS   NOTED COPIED SLEEVED

## 2021-02-23 ENCOUNTER — Telehealth: Payer: Self-pay | Admitting: Family Medicine

## 2021-02-23 NOTE — Telephone Encounter (Signed)
I have the letter completed and just waiting for Dr Moshe Cipro to review and sign it. It will be faxed today by 5pm

## 2021-02-23 NOTE — Telephone Encounter (Signed)
Pt called in regard to paperwork Pt wanted to know if paperwork she had dropped off had been faxed over to destination   Can call pt and discuss

## 2021-02-23 NOTE — Telephone Encounter (Signed)
Pt aware.

## 2021-04-28 ENCOUNTER — Other Ambulatory Visit: Payer: Self-pay

## 2021-04-28 DIAGNOSIS — Z1211 Encounter for screening for malignant neoplasm of colon: Secondary | ICD-10-CM

## 2021-06-03 ENCOUNTER — Other Ambulatory Visit: Payer: Self-pay

## 2021-06-03 DIAGNOSIS — Z1211 Encounter for screening for malignant neoplasm of colon: Secondary | ICD-10-CM

## 2021-06-03 LAB — COLOGUARD: COLOGUARD: POSITIVE — AB

## 2021-06-07 ENCOUNTER — Encounter: Payer: Self-pay | Admitting: Internal Medicine

## 2021-07-19 ENCOUNTER — Ambulatory Visit: Payer: 59 | Admitting: Family Medicine

## 2021-07-21 ENCOUNTER — Ambulatory Visit (INDEPENDENT_AMBULATORY_CARE_PROVIDER_SITE_OTHER): Payer: 59 | Admitting: Family Medicine

## 2021-07-21 ENCOUNTER — Encounter: Payer: Self-pay | Admitting: Family Medicine

## 2021-07-21 VITALS — BP 126/79 | HR 94 | Resp 16 | Ht 67.0 in | Wt 181.0 lb

## 2021-07-21 DIAGNOSIS — R195 Other fecal abnormalities: Secondary | ICD-10-CM

## 2021-07-21 DIAGNOSIS — E663 Overweight: Secondary | ICD-10-CM | POA: Diagnosis not present

## 2021-07-21 DIAGNOSIS — F17200 Nicotine dependence, unspecified, uncomplicated: Secondary | ICD-10-CM | POA: Diagnosis not present

## 2021-07-21 NOTE — Patient Instructions (Signed)
Annual with pap in 6 months, call if you need me before ? ?Please schedule mammogram at checkout ? ?Fasting lipid panel, TSH and vit D , CBC and chem 7 and EGFR 1 week before next visit. ? ?Please DO get colonoscopy as we discussed ? ?Reconsider the NEED towant to stop smoking, then move forward in that direction. Nicotine is associated with increased risk of illness ? ?It is important that you exercise regularly at least 30 minutes 5 times a week. If you develop chest pain, have severe difficulty breathing, or feel very tired, stop exercising immediately and seek medical attention  ?Think about what you will eat, plan ahead. ?Choose " clean, green, fresh or frozen" over canned, processed or packaged foods which are more sugary, salty and fatty. ?70 to 75% of food eaten should be vegetables and fruit. ?Three meals at set times with snacks allowed between meals, but they must be fruit or vegetables. ?Aim to eat over a 12 hour period , example 7 am to 7 pm, and STOP after  your last meal of the day. ?Drink water,generally about 64 ounces per day, no other drink is as healthy. Fruit juice is best enjoyed in a healthy way, by EATING the fruit. ?Thanks for choosing [facility], we consider it a privelige to serve you. ? ?

## 2021-07-22 ENCOUNTER — Ambulatory Visit (INDEPENDENT_AMBULATORY_CARE_PROVIDER_SITE_OTHER): Payer: 59 | Admitting: Family Medicine

## 2021-07-22 ENCOUNTER — Encounter: Payer: Self-pay | Admitting: Family Medicine

## 2021-07-22 VITALS — BP 126/79 | Ht 67.0 in | Wt 181.0 lb

## 2021-07-22 DIAGNOSIS — F17200 Nicotine dependence, unspecified, uncomplicated: Secondary | ICD-10-CM

## 2021-07-22 DIAGNOSIS — E663 Overweight: Secondary | ICD-10-CM

## 2021-07-22 DIAGNOSIS — Z13220 Encounter for screening for lipoid disorders: Secondary | ICD-10-CM

## 2021-07-22 DIAGNOSIS — R195 Other fecal abnormalities: Secondary | ICD-10-CM

## 2021-07-22 DIAGNOSIS — E559 Vitamin D deficiency, unspecified: Secondary | ICD-10-CM

## 2021-07-22 NOTE — Progress Notes (Signed)
? ?  Laura Guerrero     MRN: [id number]      DOB: [birth year] ? ? ?HPI ?Laura Guerrero is here for follow up refuses shingrix and is anxious asbout colonoscopy ?Preventive health is updated, specifically  Cancer screening and Immunization.   ? ?The PT denies any adverse reactions to current medications since the last visit.  ?There are no new concerns.  ?There are no specific complaints  ? ?ROS ?Denies recent fever or chills. ?Denies sinus pressure, nasal congestion, ear pain or sore throat. ?Denies chest congestion, productive cough or wheezing. ?Denies chest pains, palpitations and leg swelling ?Denies abdominal pain, nausea, vomiting,diarrhea or constipation.   ?Denies dysuria, frequency, hesitancy ?wears hand brace for intermittent pain ?Denies headaches, seizures, numbness, or tingling. ?Denies depression, anxiety or insomnia. ?Denies skin break down or rash. ? ? ?PE ? ?BP 126/79   Pulse 94   Resp 16   Ht '5\' 7"'$  (1.702 m)   Wt 181 lb (82.1 kg)   LMP 06/12/2011   SpO2 98%   BMI 28.35 kg/m?  ? ?Patient alert and oriented and in no cardiopulmonary distress. ? ?HEENT: No facial asymmetry, EOMI,     Neck supple . ? ?Chest: Clear to auscultation bilaterally. ?ABD: Soft non tender.  ? ?Ext: No edema ? ?MS: Adequate ROM spine, shoulders, hips and knees. ? ?Skin: Intact, no ulcerations or rash noted. ? ?Psych: Good eye contact, normal affect. Memory intact not anxious or depressed appearing. ? ?CNS: CN 2-12 intact, power,  normal throughout.no focal deficits noted. ? ? ?Assessment & Plan ? ?NICOTINE ADDICTION ?Asked:confirms currently smokes cigarettes ?Assess: Unwilling to set a quit date, not  cutting back ?Advise: needs to QUIT to reduce risk of cancer, cardio and cerebrovascular disease ?Assist: counseled for 5 minutes and literature provided ?Arrange: follow up in 2 to 4 months ? ? ?Overweight (BMI 25.0-29.9) ? ?Patient re-educated about  the importance of commitment to a  minimum of 150 minutes of exercise per week as  able. ? ?The importance of healthy food choices with portion control discussed, as well as eating regularly and within a 12 hour window most days. ?The need to choose "clean , green" food 50 to 75% of the time is discussed, as well as to make water the primary drink and set a goal of 64 ounces water daily. ? ?  ? ?  07/22/2021  ?  9:56 AM 07/21/2021  ?  9:48 AM 01/20/2021  ?  2:29 PM  ?Weight /BMI  ?Weight 181 lb 181 lb 182 lb  ?Height '5\' 7"'$  (1.702 m) '5\' 7"'$  (1.702 m) '5\' 7"'$  (1.702 m)  ?BMI 28.35 kg/m2 28.35 kg/m2 28.51 kg/m2  ? ? ? ? ?Positive colorectal cancer screening using Cologuard test ?Discussed how important it is that she have her colonoscope and reassured her of very low risk associated with the procedure , which she will furhter address with GI ? ?

## 2021-07-22 NOTE — Assessment & Plan Note (Signed)
?  Patient re-educated about  the importance of commitment to a  minimum of 150 minutes of exercise per week as able. ? ?The importance of healthy food choices with portion control discussed, as well as eating regularly and within a 12 hour window most days. ?The need to choose "clean , green" food 50 to 75% of the time is discussed, as well as to make water the primary drink and set a goal of 64 ounces water daily. ? ?  ? ?  07/22/2021  ?  9:56 AM 07/21/2021  ?  9:48 AM 01/20/2021  ?  2:29 PM  ?Weight /BMI  ?Weight 181 lb 181 lb 182 lb  ?Height '5\' 7"'$  (1.702 m) '5\' 7"'$  (1.702 m) '5\' 7"'$  (1.702 m)  ?BMI 28.35 kg/m2 28.35 kg/m2 28.51 kg/m2  ? ? ? ?

## 2021-07-22 NOTE — Addendum Note (Signed)
Addended by: Eual Fines on: 07/22/2021 02:39 PM ? ? Modules accepted: Orders ? ?

## 2021-07-22 NOTE — Assessment & Plan Note (Signed)
Discussed how important it is that she have her colonoscope and reassured her of very low risk associated with the procedure , which she will furhter address with GI ?

## 2021-07-22 NOTE — Progress Notes (Signed)
?     Virtual Visit via Telephone Note ? ?I connected with Laura Guerrero on 07/22/21 at 11:40 AM EDT by telephone and verified that I am speaking with the correct person using two identifiers. ? ?Location: ?Patient: home ?Provider: office ? ?  ?I discussed the limitations, risks, security and privacy concerns of performing an evaluation and management service by telephone and the availability of in person appointments. I also discussed with the patient that there may be a patient responsible charge related to this service. The patient expressed understanding and agreed to proceed. ? ? ?History of Present Illness: ?See visit from yetserday which was cut short due to problems inside of the building ?This is  just to review dischstrge instructions, and address any additional concerns, which are none ?  ?Observations/Objective: ?BP 126/79   Ht '5\' 7"'$  (1.702 m)   Wt 181 lb (82.1 kg)   LMP 06/12/2011   BMI 28.35 kg/m?  ?Good communication with no confusion and intact memory. ?Alert and oriented x 3 ?No signs of respiratory distress during speech ? ? ?Assessment and Plan: ?Positive colorectal cancer screening using Cologuard test ?Needs and agrees to colonoscopy,understands the importance ? ?NICOTINE ADDICTION ?Asked:confirms currently smokes cigarettes ?Assess: Unwilling to set a quit date, not cutting back ?Advise: needs to QUIT to reduce risk of cancer, cardio and cerebrovascular disease ?Assist: counseled for 5 minutes and literature provided ?Arrange: follow up in 2 to 4 months ? ? ?Overweight (BMI 25.0-29.9) ? ?Patient re-educated about  the importance of commitment to a  minimum of 150 minutes of exercise per week as able. ? ?The importance of healthy food choices with portion control discussed, as well as eating regularly and within a 12 hour window most days. ?The need to choose "clean , green" food 50 to 75% of the time is discussed, as well as to make water the primary drink and set a goal of 64 ounces water  daily. ? ?  ? ?  07/22/2021  ?  9:56 AM 07/21/2021  ?  9:48 AM 01/20/2021  ?  2:29 PM  ?Weight /BMI  ?Weight 181 lb 181 lb 182 lb  ?Height '5\' 7"'$  (1.702 m) '5\' 7"'$  (1.702 m) '5\' 7"'$  (1.702 m)  ?BMI 28.35 kg/m2 28.35 kg/m2 28.51 kg/m2  ? ? ? ? ? ?Follow Up Instructions: ? ?  ?I discussed the assessment and treatment plan with the patient. The patient was provided an opportunity to ask questions and all were answered. The patient agreed with the plan and demonstrated an understanding of the instructions. ?  ?The patient was advised to call back or seek an in-person evaluation if the symptoms worsen or if the condition fails to improve as anticipated. ? ?I provided 7 minutes of non-face-to-face time during this encounter. ? ? ?Tula Nakayama, MD ? ?

## 2021-07-22 NOTE — Assessment & Plan Note (Signed)
Asked:confirms currently smokes cigarettes ?Assess: Unwilling to set a quit date, not  cutting back ?Advise: needs to QUIT to reduce risk of cancer, cardio and cerebrovascular disease ?Assist: counseled for 5 minutes and literature provided ?Arrange: follow up in 2 to 4 months ? ?

## 2021-07-22 NOTE — Assessment & Plan Note (Signed)
Needs and agrees to colonoscopy,understands the importance ?

## 2021-07-22 NOTE — Patient Instructions (Signed)
Instructions as printed from visit on 07/22/2021 ? ?Thank you for a few additional minutes today to complete the visit from 07/22/2021 ?

## 2021-07-22 NOTE — Assessment & Plan Note (Signed)
Asked:confirms currently smokes cigarettes ?Assess: Unwilling to set a quit date, not cutting back ?Advise: needs to QUIT to reduce risk of cancer, cardio and cerebrovascular disease ?Assist: counseled for 5 minutes and literature provided ?Arrange: follow up in 2 to 4 months ? ?

## 2021-08-04 ENCOUNTER — Ambulatory Visit (INDEPENDENT_AMBULATORY_CARE_PROVIDER_SITE_OTHER): Payer: Self-pay | Admitting: *Deleted

## 2021-08-04 VITALS — Ht 67.0 in | Wt 179.8 lb

## 2021-08-04 DIAGNOSIS — R195 Other fecal abnormalities: Secondary | ICD-10-CM

## 2021-08-04 NOTE — Progress Notes (Signed)
Gastroenterology Pre-Procedure Review  Request Date: 08/04/2021 Requesting Physician: Dr. Tula Nakayama, no previous TCS, positive cologuard  PATIENT REVIEW QUESTIONS: The patient responded to the following health history questions as indicated:    1. Diabetes Melitis: no 2. Joint replacements in the past 12 months: no 3. Major health problems in the past 3 months: no 4. Has an artificial valve or MVP: no 5. Has a defibrillator: no 6. Has been advised in past to take antibiotics in advance of a procedure like teeth cleaning: no 7. Family history of colon cancer: no  8. Alcohol Use: yes, occasional drink on holidays 9. Illicit drug Use: no 10. History of sleep apnea: no  11. History of coronary artery or other vascular stents placed within the last 12 months: no 12. History of any prior anesthesia complications: no 13. Body mass index is 28.16 kg/m.    MEDICATIONS & ALLERGIES:    Patient reports the following regarding taking any blood thinners:   Plavix? no Aspirin? Yes, 81 mg as needed Coumadin? no Brilinta? no Xarelto? no Eliquis? no Pradaxa? no Savaysa? no Effient? no  Patient confirms/reports the following medications:  Current Outpatient Medications  Medication Sig Dispense Refill   aspirin EC 81 MG tablet Take 81 mg by mouth as needed. Swallow whole.     Calcium Carbonate-Vit D-Min (CALCIUM 1200) 1200-1000 MG-UNIT CHEW Take one tablet by mouth once daily (Patient taking differently: as needed. Take one tablet by mouth once daily) 100 tablet 3   Multiple Vitamin (MULTIVITAMIN) tablet Take 1 tablet by mouth as needed.     No current facility-administered medications for this visit.    Patient confirms/reports the following allergies:  Allergies  Allergen Reactions   Ibuprofen Dermatitis    No orders of the defined types were placed in this encounter.   AUTHORIZATION INFORMATION Primary Insurance: [facility]  [place] #: [id number],  Group #: [id number] Pre-Cert / Josem Kaufmann  required:  Pre-Cert / Auth #:   SCHEDULE INFORMATION: Procedure has been scheduled as follows:  Date: , Time:   Location: [facility] with Dr. Abbey Chatters  This Gastroenterology Pre-Precedure Review Form is being routed to the following provider(s): Venetia Night, NP

## 2021-08-04 NOTE — Progress Notes (Signed)
Pt had positive cologuard.

## 2021-08-05 NOTE — Progress Notes (Signed)
Spoke to pt.  She informed me that she would have to call me back Tuesday.  Would like to check her work schedule.

## 2021-08-11 NOTE — Progress Notes (Signed)
08/11/2021-Lmom for pt to call back.

## 2021-08-12 NOTE — Progress Notes (Signed)
Gave Mindy triage paperwork to schedule in future when pt calls back.

## 2021-08-17 ENCOUNTER — Telehealth: Payer: Self-pay | Admitting: Internal Medicine

## 2021-08-17 MED ORDER — PEG 3350-KCL-NA BICARB-NACL 420 G PO SOLR: ORAL | 0 refills | Status: DC

## 2021-08-17 NOTE — Telephone Encounter (Signed)
Called pt. She has been scheduled for TCS with Dr. Abbey Chatters, ASA 2 on 7/11 at 1:00pm. Aware will mail instructions. Rx will be sent to pharmacy.  PA approved via [facility]. Auth# [id number], DOS: Sep 20, 2021 - Dec 19, 2021

## 2021-08-17 NOTE — Telephone Encounter (Signed)
Pt was calling for AS to schedule her colonoscopy. I told her someone would be in touch with her since we have scheduling changes and someone would be in touch with her. [phone]

## 2021-09-08 ENCOUNTER — Telehealth: Payer: Self-pay | Admitting: *Deleted

## 2021-09-08 MED ORDER — CLENPIQ 10-3.5-12 MG-GM -GM/175ML PO SOLN: 1.0000 | ORAL | 0 refills | Status: DC

## 2021-09-08 NOTE — Telephone Encounter (Signed)
Received VM from pt requesting low volume prep.   Called pt advised sent in and have sent prep instructions to her [facility]

## 2021-09-20 ENCOUNTER — Other Ambulatory Visit (HOSPITAL_COMMUNITY): Payer: Self-pay | Admitting: Family Medicine

## 2021-09-20 ENCOUNTER — Ambulatory Visit (HOSPITAL_COMMUNITY): Payer: 59 | Admitting: Certified Registered Nurse Anesthetist

## 2021-09-20 ENCOUNTER — Encounter (HOSPITAL_COMMUNITY)
Admission: RE | Disposition: A | Discharge status: Home / Self Care | Payer: Self-pay | Source: Home / Self Care | Attending: Internal Medicine

## 2021-09-20 ENCOUNTER — Ambulatory Visit (HOSPITAL_COMMUNITY)
Admission: RE | Admit: 2021-09-20 | Discharge: 2021-09-20 | Disposition: A | Discharge status: Home / Self Care | Payer: 59 | Attending: Internal Medicine | Admitting: Internal Medicine

## 2021-09-20 ENCOUNTER — Other Ambulatory Visit: Payer: Self-pay

## 2021-09-20 ENCOUNTER — Ambulatory Visit (HOSPITAL_BASED_OUTPATIENT_CLINIC_OR_DEPARTMENT_OTHER): Payer: 59 | Admitting: Certified Registered Nurse Anesthetist

## 2021-09-20 ENCOUNTER — Encounter (HOSPITAL_COMMUNITY): Payer: Self-pay

## 2021-09-20 DIAGNOSIS — F17210 Nicotine dependence, cigarettes, uncomplicated: Secondary | ICD-10-CM | POA: Diagnosis not present

## 2021-09-20 DIAGNOSIS — Z1211 Encounter for screening for malignant neoplasm of colon: Secondary | ICD-10-CM | POA: Diagnosis not present

## 2021-09-20 DIAGNOSIS — Z1212 Encounter for screening for malignant neoplasm of rectum: Secondary | ICD-10-CM | POA: Diagnosis not present

## 2021-09-20 DIAGNOSIS — Z1231 Encounter for screening mammogram for malignant neoplasm of breast: Secondary | ICD-10-CM

## 2021-09-20 DIAGNOSIS — R195 Other fecal abnormalities: Secondary | ICD-10-CM | POA: Diagnosis not present

## 2021-09-20 DIAGNOSIS — K648 Other hemorrhoids: Secondary | ICD-10-CM | POA: Insufficient documentation

## 2021-09-20 HISTORY — PX: COLONOSCOPY WITH PROPOFOL: SHX5780

## 2021-09-20 SURGERY — COLONOSCOPY WITH PROPOFOL
Anesthesia: General

## 2021-09-20 MED ORDER — PROPOFOL 10 MG/ML IV BOLUS
INTRAVENOUS | Status: DC | PRN
  Administered 2021-09-20 (×3): 50 mg via INTRAVENOUS
  Administered 2021-09-20: 100 mg via INTRAVENOUS

## 2021-09-20 MED ORDER — LACTATED RINGERS IV SOLN: INTRAVENOUS | Status: DC

## 2021-09-20 NOTE — Discharge Instructions (Addendum)

## 2021-09-20 NOTE — Op Note (Signed)
[facility] Patient Name: Laura Guerrero Procedure Date: 09/20/2021 12:59 PM MRN: [id number] Date of Birth: 16-Oct-1960 Attending MD: Elon Alas. Abbey Chatters DO CSN: [id number] Age: 61 Admit Type: Outpatient Procedure:                Colonoscopy Indications:              Positive Cologuard test Providers:                Elon Alas. Abbey Chatters, DO, Tammy Vaught, RN, [place]                            Theda Sers RN, RN, [facility] [place] Referring MD:              Medicines:                See the Anesthesia note for documentation of the                            administered medications Complications:            No immediate complications. Estimated Blood Loss:     Estimated blood loss: none. Procedure:                Pre-Anesthesia Assessment:                           - The anesthesia plan was to use monitored                            anesthesia care (MAC).                           After obtaining informed consent, the colonoscope                            was passed under direct vision. Throughout the                            procedure, the patient's blood pressure, pulse, and                            oxygen saturations were monitored continuously. The                            [id number]-HQ190L ([id number]) scope was introduced through                            the anus and advanced to the the cecum, identified                            by appendiceal orifice and ileocecal valve. The                            colonoscopy was performed without difficulty. The                            patient tolerated the procedure well.  The quality                            of the bowel preparation was evaluated using the                            BBPS [facility] Bowel Preparation Scale) with scores                            of: Right Colon = 2 (minor amount of residual                            staining, small fragments of stool and/or opaque                            liquid, but mucosa seen well),  Transverse Colon = 2                            (minor amount of residual staining, small fragments                            of stool and/or opaque liquid, but mucosa seen                            well) and Left Colon = 2 (minor amount of residual                            staining, small fragments of stool and/or opaque                            liquid, but mucosa seen well). The total BBPS score                            equals 6. The quality of the bowel preparation was                            fair. Scope In: 1:03:15 PM Scope Out: 1:19:57 PM Scope Withdrawal Time: 0 hours 9 minutes 28 seconds  Total Procedure Duration: 0 hours 16 minutes 42 seconds  Findings:      The perianal and digital rectal examinations were normal.      Non-bleeding internal hemorrhoids were found during endoscopy.      A moderate amount of stool was found in the transverse colon, in the       ascending colon and in the cecum, making visualization difficult. Lavage       of the area was performed using copious amounts of sterile water,       resulting in clearance with fair visualization. Impression:               - Preparation of the colon was fair.                           - Non-bleeding internal hemorrhoids.                           -  Stool in the transverse colon, in the ascending                            colon and in the cecum.                           - No specimens collected. Moderate Sedation:      Per Anesthesia Care Recommendation:           - Patient has a contact number available for                            emergencies. The signs and symptoms of potential                            delayed complications were discussed with the                            patient. Return to normal activities tomorrow.                            Written discharge instructions were provided to the                            patient.                           - Resume previous diet.                            - Continue present medications.                           - Repeat colonoscopy in 10 years for screening                            purposes.                           - Return to GI clinic PRN. Procedure Code(s):        --- Professional ---                           [phone], Colonoscopy, flexible; diagnostic, including                            collection of specimen(s) by brushing or washing,                            when performed (separate procedure) Diagnosis Code(s):        --- Professional ---                           K64.8, Other hemorrhoids                           R19.5, Other fecal abnormalities CPT copyright 2019 American Medical  Association. All rights reserved. The codes documented in this report are preliminary and upon coder review may  be revised to meet current compliance requirements. Elon Alas. Abbey Chatters, DO [place] Abbey Chatters, DO 09/20/2021 1:24:08 PM This report has been signed electronically. Number of Addenda: 0

## 2021-09-20 NOTE — Transfer of Care (Signed)
Immediate Anesthesia Transfer of Care Note  Patient: Laura Guerrero  Procedure(s) Performed: COLONOSCOPY WITH PROPOFOL  Patient Location: Endoscopy Unit  Anesthesia Type:General  Level of Consciousness: drowsy  Airway & Oxygen Therapy: Patient Spontanous Breathing  Post-op Assessment: Report given to RN and Post -op Vital signs reviewed and stable  Post vital signs: Reviewed and stable  Last Vitals:  Vitals Value Taken Time  BP 103/61   Temp    Pulse 62   Resp 14   SpO2 100%     Last Pain:  Vitals:   09/20/21 1302  TempSrc:   PainSc: 0-No pain      Patients Stated Pain Goal: 5 (63/84/53 6468)  Complications: No notable events documented.

## 2021-09-20 NOTE — H&P (Signed)
Primary Care Physician:  Fayrene Helper, MD Primary Gastroenterologist:  Dr. Abbey Chatters  Pre-Procedure History & Physical: HPI:  Laura Guerrero is a 61 y.o. female is here  for a colonoscopy to be performed for positive cologuard testing, no previous TCS.   Past Medical History:  Diagnosis Date   Fracture of finger, middle phalanx, right, closed 03/2012   no surgical intervention    History reviewed. No pertinent surgical history.  Prior to Admission medications   Medication Sig Start Date End Date Taking? Authorizing Provider  Multiple Vitamin (MULTIVITAMIN) tablet Take 1 tablet by mouth daily.   Yes [provider]  Calcium Carbonate-Vit D-Min (CALCIUM 1200) 1200-1000 MG-UNIT CHEW Take one tablet by mouth once daily Patient not taking: Reported on 09/12/2021 01/28/19   Fayrene Helper, MD  polyethylene glycol-electrolytes (NULYTELY) 420 g solution As directed 08/17/21   Eloise Harman, DO  Sod Picosulfate-Mag Ox-Cit Acd (CLENPIQ) 10-3.5-12 MG-GM -GM/175ML SOLN Take 1 kit by mouth as directed. 09/08/21   Eloise Harman, DO    Allergies as of 08/17/2021 - Review Complete 08/04/2021  Allergen Reaction Noted   Ibuprofen Dermatitis 01/28/2019    Family History  Problem Relation Age of Onset   Diabetes Mother    Hypertension Father    Hypertension Sister    Stroke Sister    Heart disease Maternal Grandmother     Social History   Socioeconomic History   Marital status: Single    Spouse name: Not on file   Number of children: Not on file   Years of education: Not on file   Highest education level: Not on file  Occupational History   Not on file  Tobacco Use   Smoking status: Every Day    Packs/day: 0.50    Types: Cigarettes   Smokeless tobacco: Never  Vaping Use   Vaping Use: Never used  Substance and Sexual Activity   Alcohol use: Yes    Comment: occassional   Drug use: No   Sexual activity: Not on file  Other Topics Concern   Not on file  Social  History Narrative   Not on file   Social Determinants of Health   Financial Resource Strain: Not on file  Food Insecurity: Not on file  Transportation Needs: Not on file  Physical Activity: Not on file  Stress: Not on file  Social Connections: Not on file  Intimate Partner Violence: Not on file    Review of Systems: See HPI, otherwise negative ROS  Physical Exam: Vital signs in last 24 hours:     General:   Alert,  Well-developed, well-nourished, pleasant and cooperative in NAD Head:  Normocephalic and atraumatic. Eyes:  Sclera clear, no icterus.   Conjunctiva pink. Ears:  Normal auditory acuity. Nose:  No deformity, discharge,  or lesions. Mouth:  No deformity or lesions, dentition normal. Neck:  Supple; no masses or thyromegaly. Lungs:  Clear throughout to auscultation.   No wheezes, crackles, or rhonchi. No acute distress. Heart:  Regular rate and rhythm; no murmurs, clicks, rubs,  or gallops. Abdomen:  Soft, nontender and nondistended. No masses, hepatosplenomegaly or hernias noted. Normal bowel sounds, without guarding, and without rebound.   Msk:  Symmetrical without gross deformities. Normal posture. Extremities:  Without clubbing or edema. Neurologic:  Alert and  oriented x4;  grossly normal neurologically. Skin:  Intact without significant lesions or rashes. Cervical Nodes:  No significant cervical adenopathy. Psych:  Alert and cooperative. Normal mood and affect.  Impression/Plan:  Laura Guerrero is here for a colonoscopy to be performed for positive cologuard testing, no previous TCS.   The risks of the procedure including infection, bleed, or perforation as well as benefits, limitations, alternatives and imponderables have been reviewed with the patient. Questions have been answered. All parties agreeable.

## 2021-09-20 NOTE — Anesthesia Postprocedure Evaluation (Signed)
Anesthesia Post Note  Patient: Laura Guerrero  Procedure(s) Performed: COLONOSCOPY WITH PROPOFOL  Anesthesia Type: General Anesthetic complications: no   There were no known notable events for this encounter.   Last Vitals:  Vitals:   09/20/21 1128 09/20/21 1323  BP: 108/74 103/61  Pulse: (!) 57 62  Resp: 11 14  Temp: 36.6 C (!) 36.3 C  SpO2: 99% 100%    Last Pain:  Vitals:   09/20/21 1324  TempSrc:   PainSc: 0-No pain                 Trixie Rude

## 2021-09-20 NOTE — Anesthesia Preprocedure Evaluation (Signed)
Anesthesia Evaluation  Patient identified by MRN, date of birth, ID band Patient awake    Reviewed: Allergy & Precautions, NPO status , Patient's Chart, lab work & pertinent test results  Airway Mallampati: I  TM Distance: >3 FB Neck ROM: Full    Dental no notable dental hx.    Pulmonary neg pulmonary ROS, Current Smoker and Patient abstained from smoking.,    Pulmonary exam normal        Cardiovascular Exercise Tolerance: Good negative cardio ROS Normal cardiovascular exam     Neuro/Psych negative neurological ROS     GI/Hepatic negative GI ROS, Neg liver ROS,   Endo/Other  negative endocrine ROS  Renal/GU negative Renal ROS     Musculoskeletal negative musculoskeletal ROS (+)   Abdominal Normal abdominal exam  (+)   Peds  Hematology negative hematology ROS (+)   Anesthesia Other Findings   Reproductive/Obstetrics                             Anesthesia Physical Anesthesia Plan  ASA: 2  Anesthesia Plan: General   Post-op Pain Management:    Induction: Intravenous  PONV Risk Score and Plan: 2 and TIVA  Airway Management Planned: Nasal Cannula  Additional Equipment:   Intra-op Plan:   Post-operative Plan:   Informed Consent: I have reviewed the patients History and Physical, chart, labs and discussed the procedure including the risks, benefits and alternatives for the proposed anesthesia with the patient or authorized representative who has indicated his/her understanding and acceptance.     Dental advisory given  Plan Discussed with: CRNA  Anesthesia Plan Comments:         Anesthesia Quick Evaluation

## 2021-09-20 NOTE — Anesthesia Postprocedure Evaluation (Signed)
Anesthesia Post Note  Patient: Laura Guerrero  Procedure(s) Performed: COLONOSCOPY WITH PROPOFOL  Patient location during evaluation: Phase II Anesthesia Type: General Level of consciousness: awake and alert Pain management: pain level controlled Vital Signs Assessment: post-procedure vital signs reviewed and stable Respiratory status: spontaneous breathing, nonlabored ventilation, respiratory function stable and patient connected to nasal cannula oxygen Cardiovascular status: blood pressure returned to baseline and stable Postop Assessment: no apparent nausea or vomiting Anesthetic complications: no   There were no known notable events for this encounter.   Last Vitals:  Vitals:   09/20/21 1128 09/20/21 1323  BP: 108/74 103/61  Pulse: (!) 57 62  Resp: 11 14  Temp: 36.6 C (!) 36.3 C  SpO2: 99% 100%    Last Pain:  Vitals:   09/20/21 1324  TempSrc:   PainSc: 0-No pain                 Trixie Rude

## 2021-09-26 ENCOUNTER — Encounter (HOSPITAL_COMMUNITY): Payer: Self-pay | Admitting: Internal Medicine

## 2021-10-10 ENCOUNTER — Ambulatory Visit (HOSPITAL_COMMUNITY): Payer: 59

## 2021-10-17 ENCOUNTER — Ambulatory Visit (HOSPITAL_COMMUNITY)
Admission: RE | Admit: 2021-10-17 | Discharge: 2021-10-17 | Disposition: A | Discharge status: Home / Self Care | Payer: 59 | Source: Ambulatory Visit | Attending: Family Medicine | Admitting: Family Medicine

## 2021-10-17 DIAGNOSIS — Z1231 Encounter for screening mammogram for malignant neoplasm of breast: Secondary | ICD-10-CM | POA: Diagnosis present

## 2021-11-23 ENCOUNTER — Ambulatory Visit (INDEPENDENT_AMBULATORY_CARE_PROVIDER_SITE_OTHER): Payer: 59

## 2021-11-23 DIAGNOSIS — Z23 Encounter for immunization: Secondary | ICD-10-CM

## 2021-12-05 ENCOUNTER — Ambulatory Visit (INDEPENDENT_AMBULATORY_CARE_PROVIDER_SITE_OTHER): Payer: 59 | Admitting: Internal Medicine

## 2021-12-05 ENCOUNTER — Encounter: Payer: Self-pay | Admitting: Internal Medicine

## 2021-12-05 VITALS — BP 124/82 | HR 75 | Resp 18 | Ht 67.0 in | Wt 179.8 lb

## 2021-12-05 DIAGNOSIS — L02412 Cutaneous abscess of left axilla: Secondary | ICD-10-CM | POA: Diagnosis not present

## 2021-12-05 MED ORDER — CEPHALEXIN 500 MG PO CAPS: 500.0000 mg | ORAL_CAPSULE | Freq: Three times a day (TID) | ORAL | 0 refills | Status: DC

## 2021-12-05 NOTE — Progress Notes (Signed)
Acute Office Visit  Subjective:    Patient ID: Laura Guerrero, female    DOB: January 07, 1961, 61 y.o.   MRN: [id number]  Chief Complaint  Patient presents with   Acute Visit    Boil under left arm it is sore noticed 12-02-21    HPI Patient is in today for complaint of a boil in the left axilla for the last 4 days.  She denies any fever or chills currently.  Denies any pain, but has discomfort due to the boil.  She denies any recent injury.  Had shaved her axillae about a week before she noticed a boil.  She denies any previous history of similar abscess.  Past Medical History:  Diagnosis Date   Fracture of finger, middle phalanx, right, closed 03/2012   no surgical intervention    Past Surgical History:  Procedure Laterality Date   COLONOSCOPY WITH PROPOFOL N/A 09/20/2021   Procedure: COLONOSCOPY WITH PROPOFOL;  Surgeon: Eloise Harman, DO;  Location: [facility];  Service: Endoscopy;  Laterality: N/A;  1:00pm    Family History  Problem Relation Age of Onset   Diabetes Mother    Hypertension Father    Hypertension Sister    Stroke Sister    Heart disease Maternal Grandmother     Social History   Socioeconomic History   Marital status: Single    Spouse name: Not on file   Number of children: Not on file   Years of education: Not on file   Highest education level: Not on file  Occupational History   Not on file  Tobacco Use   Smoking status: Every Day    Packs/day: 0.50    Types: Cigarettes   Smokeless tobacco: Never  Vaping Use   Vaping Use: Never used  Substance and Sexual Activity   Alcohol use: Yes    Comment: occassional   Drug use: No   Sexual activity: Not on file  Other Topics Concern   Not on file  Social History Narrative   Not on file   Social Determinants of Health   Financial Resource Strain: Not on file  Food Insecurity: Not on file  Transportation Needs: Not on file  Physical Activity: Not on file  Stress: Not on file  Social  Connections: Not on file  Intimate Partner Violence: Not on file    Outpatient Medications Prior to Visit  Medication Sig Dispense Refill   Multiple Vitamin (MULTIVITAMIN) tablet Take 1 tablet by mouth daily.     Calcium Carbonate-Vit D-Min (CALCIUM 1200) 1200-1000 MG-UNIT CHEW Take one tablet by mouth once daily (Patient not taking: Reported on 09/12/2021) 100 tablet 3   No facility-administered medications prior to visit.    Allergies  Allergen Reactions   Ibuprofen Dermatitis    Review of Systems  Constitutional:  Negative for chills and fever.  Respiratory:  Negative for cough and shortness of breath.   Cardiovascular:  Negative for chest pain and palpitations.  Genitourinary:  Negative for dysuria and hematuria.  Musculoskeletal:  Negative for neck pain and neck stiffness.  Skin:        Boil in the left axilla  Neurological:  Negative for dizziness and weakness.  Psychiatric/Behavioral:  Negative for agitation and behavioral problems.        Objective:    Physical Exam Constitutional:      General: She is not in acute distress.    Appearance: She is not diaphoretic.  Eyes:     General: No  scleral icterus.    Extraocular Movements: Extraocular movements intact.  Cardiovascular:     Rate and Rhythm: Normal rate and regular rhythm.     Heart sounds: Normal heart sounds. No murmur heard. Pulmonary:     Breath sounds: Normal breath sounds. No wheezing or rales.  Skin:    Comments: Skin abscess about 4 cm in diameter, slightly tender with central fluctuance  Neurological:     General: No focal deficit present.     Mental Status: She is alert and oriented to person, place, and time.  Psychiatric:        Mood and Affect: Mood normal.        Behavior: Behavior normal.     BP 124/82 (BP Location: Right Arm, Patient Position: Sitting, Cuff Size: Normal)   Pulse 75   Resp 18   Ht '5\' 7"'$  (1.702 m)   Wt 179 lb 12.8 oz (81.6 kg)   LMP 06/12/2011   SpO2 97%   BMI 28.16  kg/m  Wt Readings from Last 3 Encounters:  12/05/21 179 lb 12.8 oz (81.6 kg)  09/20/21 180 lb (81.6 kg)  08/04/21 179 lb 12.8 oz (81.6 kg)        Assessment & Plan:   Problem List Items Addressed This Visit    Visit Diagnoses     Abscess of axilla, left    -  Primary Likely due to recent shaving activity - carbuncle Started Keflex for secondary Ppx Advised to keep area clean and dry Referred to general surgery for possible I&D   Relevant Medications   cephALEXin (KEFLEX) 500 MG capsule   Other Relevant Orders   Ambulatory referral to General Surgery        Meds ordered this encounter  Medications   cephALEXin (KEFLEX) 500 MG capsule    Sig: Take 1 capsule (500 mg total) by mouth 3 (three) times daily.    Dispense:  15 capsule    Refill:  0     Nichol Ator Keith Rake, MD

## 2021-12-07 ENCOUNTER — Ambulatory Visit: Payer: 59 | Admitting: General Surgery

## 2022-01-24 ENCOUNTER — Other Ambulatory Visit (HOSPITAL_COMMUNITY)
Admission: RE | Admit: 2022-01-24 | Discharge: 2022-01-24 | Disposition: A | Discharge status: Home / Self Care | Payer: 59 | Source: Ambulatory Visit | Attending: Family Medicine | Admitting: Family Medicine

## 2022-01-24 ENCOUNTER — Ambulatory Visit (INDEPENDENT_AMBULATORY_CARE_PROVIDER_SITE_OTHER): Payer: 59 | Admitting: Family Medicine

## 2022-01-24 ENCOUNTER — Encounter: Payer: Self-pay | Admitting: Family Medicine

## 2022-01-24 VITALS — BP 129/84 | HR 66 | Ht 67.0 in | Wt 177.1 lb

## 2022-01-24 DIAGNOSIS — Z124 Encounter for screening for malignant neoplasm of cervix: Secondary | ICD-10-CM

## 2022-01-24 DIAGNOSIS — Z0001 Encounter for general adult medical examination with abnormal findings: Secondary | ICD-10-CM | POA: Diagnosis not present

## 2022-01-24 DIAGNOSIS — R195 Other fecal abnormalities: Secondary | ICD-10-CM

## 2022-01-24 DIAGNOSIS — Z0000 Encounter for general adult medical examination without abnormal findings: Secondary | ICD-10-CM

## 2022-01-24 DIAGNOSIS — F17200 Nicotine dependence, unspecified, uncomplicated: Secondary | ICD-10-CM | POA: Diagnosis not present

## 2022-01-24 LAB — BMP8+EGFR
BUN/Creatinine Ratio: 13 (ref 12–28)
BUN: 13 mg/dL (ref 8–27)
CO2: 24 mmol/L (ref 20–29)
Calcium: 9.4 mg/dL (ref 8.7–10.3)
Chloride: 104 mmol/L (ref 96–106)
Creatinine, Ser: 0.97 mg/dL (ref 0.57–1.00)
Glucose: 87 mg/dL (ref 70–99)
Potassium: 4.6 mmol/L (ref 3.5–5.2)
Sodium: 141 mmol/L (ref 134–144)
eGFR: 66 mL/min/{1.73_m2} (ref >59)

## 2022-01-24 LAB — LIPID PANEL
Chol/HDL Ratio: 3.6 ratio (ref 0.0–4.4)
Cholesterol, Total: 193 mg/dL (ref 100–199)
HDL: 53 mg/dL (ref >39)
LDL Chol Calc (NIH): 112 mg/dL — ABNORMAL HIGH (ref 0–99)
Triglycerides: 160 mg/dL — ABNORMAL HIGH (ref 0–149)
VLDL Cholesterol Cal: 28 mg/dL (ref 5–40)

## 2022-01-24 LAB — CBC
Hematocrit: 36.9 % (ref 34.0–46.6)
Hemoglobin: 12.3 g/dL (ref 11.1–15.9)
MCH: 29.4 pg (ref 26.6–33.0)
MCHC: 33.3 g/dL (ref 31.5–35.7)
MCV: 88 fL (ref 79–97)
Platelets: 265 10*3/uL (ref 150–450)
RBC: 4.19 x10E6/uL (ref 3.77–5.28)
RDW: 13.1 % (ref 11.7–15.4)
WBC: 3.8 10*3/uL (ref 3.4–10.8)

## 2022-01-24 LAB — VITAMIN D 25 HYDROXY (VIT D DEFICIENCY, FRACTURES): Vit D, 25-Hydroxy: 30.3 ng/mL (ref 30.0–100.0)

## 2022-01-24 LAB — TSH: TSH: 0.764 u[IU]/mL (ref 0.450–4.500)

## 2022-01-24 NOTE — Patient Instructions (Signed)
Annual exam in 1 year, call if you need me sooner, pls call 1 month before for lab order  Excellent labs and cholesterol improving  Please try to cut back cigarettes  It is important that you exercise regularly at least 30 minutes 5 times a week. If you develop chest pain, have severe difficulty breathing, or feel very tired, stop exercising immediately and seek medical attention    Best for 2024!

## 2022-01-24 NOTE — Progress Notes (Signed)
    Dayzee Trower     MRN: [id number]      DOB: [birth year]  HPI: Patient is in for annual physical exam. No other health concerns are expressed or addressed at the visit. Recent labs,  are reviewed. Immunization is reviewed , and  updated if needed.   PE: BP 129/84 (BP Location: Right Arm, Patient Position: Sitting, Cuff Size: Large)   Pulse 66   Ht '5\' 7"'$  (1.702 m)   Wt 177 lb 1.9 oz (80.3 kg)   LMP 06/12/2011   SpO2 100%   BMI 27.74 kg/m   Pleasant  female, alert and oriented x 3, in no cardio-pulmonary distress. Afebrile. HEENT No facial trauma or asymetry. Sinuses non tender.  Extra occullar muscles intact.. External ears normal, . Neck: supple, no adenopathy,JVD or thyromegaly.No bruits.  Chest: Clear to ascultation bilaterally.No crackles or wheezes. Non tender to palpation  Breast: No asymetry,no masses or lumps. No tenderness. No nipple discharge or inversion. No axillary or supraclavicular adenopathy  Cardiovascular system; Heart sounds normal,  S1 and  S2 ,no S3.  No murmur, or thrill. Apical beat not displaced Peripheral pulses normal.  Abdomen: Soft, non tender, no organomegaly or masses. No bruits. Bowel sounds normal. No guarding, tenderness or rebound.   GU: External genitalia normal female genitalia , normal female distribution of hair. No lesions. Urethral meatus normal in size, no  Prolapse, no lesions visibly  Present. Bladder non tender. Vagina pink and moist , with no visible lesions , discharge present . Adequate pelvic support no  cystocele or rectocele noted Cervix pink and appears healthy, no lesions or ulcerations noted, no discharge noted from os Uterus normal size, no adnexal masses, no cervical motion or adnexal tenderness.   Musculoskeletal exam: Full ROM of spine, hips , shoulders and knees. No deformity ,swelling or crepitus noted. No muscle wasting or atrophy.   Neurologic: Cranial nerves 2 to 12 intact. Power, tone  ,sensation and reflexes normal throughout. No disturbance in gait. No tremor.  Skin: Intact, no ulceration, erythema , scaling or rash noted. Lipoma of scalp, anterior Pigmentation normal throughout  Psych; Normal mood and affect. Judgement and concentration normal   Assessment & Plan:  NICOTINE ADDICTION Asked:confirms currently smokes cigarettes approx 6 to 7/ day Assess: Unwilling to set a quit date,  Advise: needs to QUIT to reduce risk of cancer, cardio and cerebrovascular disease Assist: counseled for 5 minutes and literature provided Arrange: follow up in 2 to 4 months   Encounter for annual physical exam Annual exam as documented. Counseling done  re healthy lifestyle involving commitment to 150 minutes exercise per week, heart healthy diet, and attaining healthy weight.The importance of adequate sleep also discussed. Regular seat belt use and home safety, is also discussed. Changes in health habits are decided on by the patient with goals and time frames  set for achieving them. Immunization and cancer screening needs are specifically addressed at this visit.

## 2022-01-24 NOTE — Assessment & Plan Note (Signed)

## 2022-01-24 NOTE — Assessment & Plan Note (Signed)
Asked:confirms currently smokes cigarettes approx 6 to 7/ day Assess: Unwilling to set a quit date,  Advise: needs to QUIT to reduce risk of cancer, cardio and cerebrovascular disease Assist: counseled for 5 minutes and literature provided Arrange: follow up in 2 to 4 months

## 2022-01-26 LAB — CYTOLOGY - PAP
Comment: NEGATIVE
Diagnosis: NEGATIVE
High risk HPV: NEGATIVE

## 2022-04-03 ENCOUNTER — Telehealth: Payer: Self-pay | Admitting: Family Medicine

## 2022-04-03 NOTE — Telephone Encounter (Signed)
Physician Results form   Copied Noted Sleeved  Fax forms when completed / mail copy to patient. Call patient when forms are faxed.

## 2022-04-04 DIAGNOSIS — Z0279 Encounter for issue of other medical certificate: Secondary | ICD-10-CM

## 2022-06-01 ENCOUNTER — Encounter: Payer: Self-pay | Admitting: Family Medicine

## 2022-06-01 ENCOUNTER — Ambulatory Visit (INDEPENDENT_AMBULATORY_CARE_PROVIDER_SITE_OTHER): Payer: 59 | Admitting: Family Medicine

## 2022-06-01 VITALS — BP 138/84 | HR 71 | Ht 67.0 in | Wt 184.0 lb

## 2022-06-01 DIAGNOSIS — S63693A Other sprain of left middle finger, initial encounter: Secondary | ICD-10-CM | POA: Diagnosis not present

## 2022-06-01 MED ORDER — PREDNISONE 10 MG PO TABS: 10.0000 mg | ORAL_TABLET | Freq: Two times a day (BID) | ORAL | 0 refills | Status: AC

## 2022-06-01 NOTE — Patient Instructions (Signed)
It was pleasure meeting with you today. Please take medications as prescribed. Follow up with your primary health provider if any health concerns arises.  

## 2022-06-01 NOTE — Progress Notes (Signed)
Patient Office Visit   Subjective   Patient ID: Laura Guerrero, female    DOB: 11/12/1960  Age: 62 y.o. MRN: [id number]  CC:  Chief Complaint  Patient presents with   Hand Pain    Patient complains of L middle finger getting tight, sore, stiff and popping for 2 weeks.     HPI Laura Guerrero 62 year old female presents to the clinic for left middle finger sprain for the past 2 weeks.She  has a past medical history of Fracture of finger, middle phalanx, right, closed (03/2012).  Hand Pain  There was no injury mechanism. The pain is present in the left fingers. The quality of the pain is described as aching. The pain does not radiate. The pain is at a severity of 6/10. The pain is moderate. The Left middle finger pain has been Intermittent since the incident. Pertinent negatives include no numbness or tingling. The symptoms are aggravated by movement. She has tried acetaminophen for the symptoms. The treatment provided mild relief.      Outpatient Encounter Medications as of 06/01/2022  Medication Sig   Calcium Carbonate-Vit D-Min (CALCIUM 1200) 1200-1000 MG-UNIT CHEW Take one tablet by mouth once daily   Multiple Vitamin (MULTIVITAMIN) tablet Take 1 tablet by mouth daily.   predniSONE (DELTASONE) 10 MG tablet Take 1 tablet (10 mg total) by mouth 2 (two) times daily with a meal for 5 days.   No facility-administered encounter medications on file as of 06/01/2022.    Past Surgical History:  Procedure Laterality Date   COLONOSCOPY WITH PROPOFOL N/A 09/20/2021   Procedure: COLONOSCOPY WITH PROPOFOL;  Surgeon: Eloise Harman, DO;  Location: [facility];  Service: Endoscopy;  Laterality: N/A;  1:00pm    Review of Systems  Constitutional:  Negative for chills and fever.  Respiratory:  Negative for shortness of breath.   Cardiovascular:  Negative for chest pain.  Gastrointestinal:  Negative for abdominal pain.  Genitourinary:  Negative for dysuria.  Neurological:  Negative for  dizziness and headaches.      Objective    BP 138/84   Pulse 71   Ht 5\' 7"  (1.702 m)   Wt 184 lb (83.5 kg)   LMP 06/12/2011   SpO2 96%   BMI 28.82 kg/m   Physical Exam Cardiovascular:     Rate and Rhythm: Normal rate and regular rhythm.     Pulses: Normal pulses.     Heart sounds: Normal heart sounds.  Pulmonary:     Effort: Pulmonary effort is normal.     Breath sounds: Normal breath sounds.  Musculoskeletal:        General: Normal range of motion.     Right hand: No swelling or tenderness. Normal range of motion. Normal strength. Normal pulse.     Left hand: Normal. No swelling or tenderness. Normal range of motion. Normal strength. Normal pulse.  Skin:    General: Skin is warm and dry.     Capillary Refill: Capillary refill takes less than 2 seconds.  Neurological:     General: No focal deficit present.     Mental Status: She is alert.  Psychiatric:        Thought Content: Thought content normal.       Assessment & Plan:  Other sprain of left middle finger, initial encounter Assessment & Plan: Prednisone 10 mg x 5 days, advise to use finger splint. Explained to patient Non pharmacological interventions include the use of ice or heat, rest,  recommend range of motion exercises, gentle stretching. The use of NSAIDs for pain management.  Follow up for worsening or persistent symptoms. Patient verbalizes understanding regarding plan of care and all questions answered.   Orders: -     predniSONE; Take 1 tablet (10 mg total) by mouth 2 (two) times daily with a meal for 5 days.  Dispense: 10 tablet; Refill: 0    No follow-ups on file.   Renard Hamper Ria Comment, FNP

## 2022-06-01 NOTE — Assessment & Plan Note (Addendum)
Prednisone 10 mg x 5 days, advise to use finger splint. Explained to patient Non pharmacological interventions include the use of ice or heat, rest, recommend range of motion exercises, gentle stretching. The use of NSAIDs for pain management.  Follow up for worsening or persistent symptoms. Patient verbalizes understanding regarding plan of care and all questions answered.

## 2022-10-23 ENCOUNTER — Other Ambulatory Visit (HOSPITAL_COMMUNITY): Payer: Self-pay | Admitting: Family Medicine

## 2022-10-23 DIAGNOSIS — Z1231 Encounter for screening mammogram for malignant neoplasm of breast: Secondary | ICD-10-CM

## 2022-11-09 ENCOUNTER — Ambulatory Visit (HOSPITAL_COMMUNITY)
Admission: RE | Admit: 2022-11-09 | Discharge: 2022-11-09 | Disposition: A | Discharge status: Home / Self Care | Payer: 59 | Source: Ambulatory Visit | Attending: Family Medicine | Admitting: Family Medicine

## 2022-11-09 DIAGNOSIS — Z1231 Encounter for screening mammogram for malignant neoplasm of breast: Secondary | ICD-10-CM | POA: Diagnosis not present

## 2022-12-22 ENCOUNTER — Telehealth: Payer: Self-pay | Admitting: Family Medicine

## 2022-12-22 NOTE — Telephone Encounter (Signed)
FMLA forms to help with mother Melina Fiddler  Noted Copied Sleeved (put in provider box)  Fax Attn: Perrin Maltese [phone]  Call patient when ready for a copy

## 2022-12-26 NOTE — Telephone Encounter (Signed)
Patient called back today forms will expire Friday, 10.18.2024, needs forms faxed in by Thursday, 10.17.2024 if at all possible

## 2022-12-26 NOTE — Telephone Encounter (Signed)
RETURNED TO YOU SIGNED, [facility]

## 2022-12-27 NOTE — Telephone Encounter (Signed)
Forms faxed. Pt called for pick up

## 2023-01-08 NOTE — Telephone Encounter (Signed)
FMLA   Forms needs to be updated  Page 2 of 3 (1B) mark out lifetime and added 03-12-2024  And  Page 3 of 3 (4B) leave end date change to 03-12-2024  Page 3 of 3 4. (4B) frequency of incapacity a.) number of episodes: 7 days per month (check the month box)   Copied Noted Sleeved (put back in provider box)    Fax Attn: Perrin Maltese [phone]   Call patient when ready for a cop

## 2023-01-30 ENCOUNTER — Encounter: Payer: 59 | Admitting: Family Medicine

## 2023-02-21 NOTE — Telephone Encounter (Signed)
Patient came by office and pickup form.

## 2023-04-06 ENCOUNTER — Encounter: Payer: Self-pay | Admitting: Family Medicine

## 2023-04-06 ENCOUNTER — Ambulatory Visit (INDEPENDENT_AMBULATORY_CARE_PROVIDER_SITE_OTHER): Payer: 59 | Admitting: Family Medicine

## 2023-04-06 ENCOUNTER — Other Ambulatory Visit (HOSPITAL_COMMUNITY): Payer: Self-pay | Admitting: Family Medicine

## 2023-04-06 VITALS — BP 131/84 | Ht 67.0 in | Wt 180.1 lb

## 2023-04-06 DIAGNOSIS — E559 Vitamin D deficiency, unspecified: Secondary | ICD-10-CM

## 2023-04-06 DIAGNOSIS — Z23 Encounter for immunization: Secondary | ICD-10-CM | POA: Diagnosis not present

## 2023-04-06 DIAGNOSIS — Z1231 Encounter for screening mammogram for malignant neoplasm of breast: Secondary | ICD-10-CM

## 2023-04-06 DIAGNOSIS — E663 Overweight: Secondary | ICD-10-CM

## 2023-04-06 DIAGNOSIS — Z0001 Encounter for general adult medical examination with abnormal findings: Secondary | ICD-10-CM

## 2023-04-06 DIAGNOSIS — Z13220 Encounter for screening for lipoid disorders: Secondary | ICD-10-CM

## 2023-04-06 DIAGNOSIS — F17200 Nicotine dependence, unspecified, uncomplicated: Secondary | ICD-10-CM

## 2023-04-06 DIAGNOSIS — Z122 Encounter for screening for malignant neoplasm of respiratory organs: Secondary | ICD-10-CM

## 2023-04-06 NOTE — Patient Instructions (Addendum)
Annual exam in 12 months, call if you need me sooner  Flu vaccine today  Need covid vaccine at pharmacy  CBC, lipid, cmp and EGFR, tSH and vit D today  Please sched mammogram at checkout   Chest scan will be scheduled  Try to reduce cigarettes buy 1 every quarter  .It is important that you exercise regularly at least 30 minutes 5 times a week. If you develop chest pain, have severe difficulty breathing, or feel very tired, stop exercising immediately and seek medical attention   >BEST for 2025!  Thanks for choosing [facility], we consider it a privelige to serve you.

## 2023-04-07 LAB — CMP14+EGFR
ALT: 36 [IU]/L — ABNORMAL HIGH (ref 0–32)
AST: 30 [IU]/L (ref 0–40)
Albumin: 4.1 g/dL (ref 3.9–4.9)
Alkaline Phosphatase: 85 [IU]/L (ref 44–121)
BUN/Creatinine Ratio: 15 (ref 12–28)
BUN: 14 mg/dL (ref 8–27)
Bilirubin Total: 0.6 mg/dL (ref 0.0–1.2)
CO2: 22 mmol/L (ref 20–29)
Calcium: 9.8 mg/dL (ref 8.7–10.3)
Chloride: 105 mmol/L (ref 96–106)
Creatinine, Ser: 0.92 mg/dL (ref 0.57–1.00)
Globulin, Total: 2.7 g/dL (ref 1.5–4.5)
Glucose: 88 mg/dL (ref 70–99)
Potassium: 4.4 mmol/L (ref 3.5–5.2)
Sodium: 142 mmol/L (ref 134–144)
Total Protein: 6.8 g/dL (ref 6.0–8.5)
eGFR: 70 mL/min/{1.73_m2} (ref >59)

## 2023-04-07 LAB — LIPID PANEL
Chol/HDL Ratio: 3.8 {ratio} (ref 0.0–4.4)
Cholesterol, Total: 221 mg/dL — ABNORMAL HIGH (ref 100–199)
HDL: 58 mg/dL (ref >39)
LDL Chol Calc (NIH): 142 mg/dL — ABNORMAL HIGH (ref 0–99)
Triglycerides: 120 mg/dL (ref 0–149)
VLDL Cholesterol Cal: 21 mg/dL (ref 5–40)

## 2023-04-07 LAB — VITAMIN D 25 HYDROXY (VIT D DEFICIENCY, FRACTURES): Vit D, 25-Hydroxy: 19.3 ng/mL — ABNORMAL LOW (ref 30.0–100.0)

## 2023-04-07 LAB — CBC
Hematocrit: 36.7 % (ref 34.0–46.6)
Hemoglobin: 12.2 g/dL (ref 11.1–15.9)
MCH: 29.3 pg (ref 26.6–33.0)
MCHC: 33.2 g/dL (ref 31.5–35.7)
MCV: 88 fL (ref 79–97)
Platelets: 275 10*3/uL (ref 150–450)
RBC: 4.17 x10E6/uL (ref 3.77–5.28)
RDW: 13.1 % (ref 11.7–15.4)
WBC: 5 10*3/uL (ref 3.4–10.8)

## 2023-04-07 LAB — TSH: TSH: 0.878 u[IU]/mL (ref 0.450–4.500)

## 2023-04-11 ENCOUNTER — Telehealth: Payer: Self-pay

## 2023-04-11 NOTE — Telephone Encounter (Signed)
Copied from CRM [id number]. Topic: General - Other >> Apr 10, 2023  3:51 PM Carlatta H wrote: Reason for CRM: Patient received a call from the office//There aren't any notes in chart//Please call back//

## 2023-04-11 NOTE — Telephone Encounter (Signed)
Patient aware of lab results.

## 2023-04-18 ENCOUNTER — Encounter: Payer: Self-pay | Admitting: Family Medicine

## 2023-04-18 DIAGNOSIS — Z23 Encounter for immunization: Secondary | ICD-10-CM | POA: Insufficient documentation

## 2023-04-18 DIAGNOSIS — Z0001 Encounter for general adult medical examination with abnormal findings: Secondary | ICD-10-CM | POA: Insufficient documentation

## 2023-04-18 NOTE — Assessment & Plan Note (Signed)
 After obtaining informed consent, the influenza vaccine is  administered , with no adverse effect noted at the time of administration.

## 2023-04-18 NOTE — Assessment & Plan Note (Signed)
Annual exam as documented. . Immunization and cancer screening needs are specifically addressed at this visit.  

## 2023-04-18 NOTE — Assessment & Plan Note (Signed)
 Current smoker with over 20 pack year history, needs screening will refer

## 2023-09-27 ENCOUNTER — Encounter: Payer: Self-pay | Admitting: Family Medicine

## 2023-09-27 ENCOUNTER — Ambulatory Visit (INDEPENDENT_AMBULATORY_CARE_PROVIDER_SITE_OTHER): Admitting: Family Medicine

## 2023-09-27 ENCOUNTER — Other Ambulatory Visit (HOSPITAL_COMMUNITY)
Admission: RE | Admit: 2023-09-27 | Discharge: 2023-09-27 | Disposition: A | Discharge status: Home / Self Care | Source: Ambulatory Visit | Attending: Family Medicine | Admitting: Family Medicine

## 2023-09-27 VITALS — BP 134/75 | HR 86 | Resp 18 | Ht 67.0 in | Wt 178.0 lb

## 2023-09-27 DIAGNOSIS — Z01419 Encounter for gynecological examination (general) (routine) without abnormal findings: Secondary | ICD-10-CM

## 2023-09-27 DIAGNOSIS — Z124 Encounter for screening for malignant neoplasm of cervix: Secondary | ICD-10-CM | POA: Diagnosis not present

## 2023-09-27 DIAGNOSIS — E7800 Pure hypercholesterolemia, unspecified: Secondary | ICD-10-CM | POA: Diagnosis not present

## 2023-09-27 DIAGNOSIS — E663 Overweight: Secondary | ICD-10-CM

## 2023-09-27 DIAGNOSIS — N760 Acute vaginitis: Secondary | ICD-10-CM

## 2023-09-27 DIAGNOSIS — F17200 Nicotine dependence, unspecified, uncomplicated: Secondary | ICD-10-CM

## 2023-09-27 DIAGNOSIS — Z0001 Encounter for general adult medical examination with abnormal findings: Secondary | ICD-10-CM

## 2023-09-27 DIAGNOSIS — Z202 Contact with and (suspected) exposure to infections with a predominantly sexual mode of transmission: Secondary | ICD-10-CM | POA: Diagnosis not present

## 2023-09-27 DIAGNOSIS — Z122 Encounter for screening for malignant neoplasm of respiratory organs: Secondary | ICD-10-CM | POA: Insufficient documentation

## 2023-09-27 MED ORDER — VITAMIN D (ERGOCALCIFEROL) 1.25 MG (50000 UNIT) PO CAPS: 50000.0000 [IU] | ORAL_CAPSULE | ORAL | 2 refills | Status: AC

## 2023-09-27 NOTE — Patient Instructions (Addendum)
 F/U in January  Aim for 5 ciggs or less per day by January, good you are cutting back  Call [email or web address] for help 24/7  Need to get Chest scan for lung cncer screen so pls answer the call to schedule   Lipid and hepatic panel and HIV today Nuswab for all STD testing today and pap  Weely vit D is prescribed  Fasting CBC, lipid, cmp and EGFr TSH and vit D 3 to 5 days before [facility] appt

## 2023-09-27 NOTE — Assessment & Plan Note (Signed)
Asked:confirms currently smokes cigarettes 10/day Assess: Unwilling to set a quit date, but is cutting back Advise: needs to QUIT to reduce risk of cancer, cardio and cerebrovascular disease Assist: counseled for 5 minutes and literature provided Arrange: follow up in 2 to 4 months  

## 2023-09-27 NOTE — Assessment & Plan Note (Signed)
 Hiv screen sent

## 2023-09-27 NOTE — Progress Notes (Signed)
 Laura Guerrero     MRN: [id number]      DOB: 20-Feb-1961  Chief Complaint  Patient presents with   Gynecologic Exam    Pt here for pap per her request, also wanting blood work done     HPI: Patient is in for gyne  exam. Requests STD testing for malodorous d/c. . Immunization is reviewed , and  updated if needed.   PE: BP 134/75   Pulse 86   Resp 18   Ht 5' 7 (1.702 m)   Wt 178 lb (80.7 kg)   LMP 06/12/2011   SpO2 97%   BMI 27.88 kg/m   Pleasant  female, alert and oriented x 3, in no cardio-pulmonary distress. Afebrile. HEENT No facial trauma or asymetry. Sinuses non tender.  Extra occullar muscles intact.. External ears normal, . Neck: supple, no adenopathy,JVD or thyromegaly.No bruits.  Chest: Clear to ascultation bilaterally.No crackles or wheezes. Non tender to palpation  Breast: No asymetry,no masses or lumps. No tenderness. No nipple discharge or inversion. No axillary or supraclavicular adenopathy  Cardiovascular system; Heart sounds normal,  S1 and  S2 ,no S3.  No murmur, or thrill. Apical beat not displaced Peripheral pulses normal.  Abdomen: Soft, non tender, no organomegaly or masses. No bruits. Bowel sounds normal. No guarding, tenderness or rebound.   GU: External genitalia normal female genitalia , normal female distribution of hair. No lesions. Urethral meatus normal in size, no  Prolapse, no lesions visibly  Present. Bladder non tender. Vagina pink and moist , with no visible lesions , discharge present . Adequate pelvic support no  cystocele or rectocele noted Cervix pink and appears healthy, no lesions or ulcerations noted, no discharge noted from os Uterus normal size, no adnexal masses, no cervical motion or adnexal tenderness.   Musculoskeletal exam: Full ROM of spine, hips , shoulders and knees. No deformity ,swelling or crepitus noted. No muscle wasting or atrophy.   Neurologic: Normal mood and affect. Judgement and  concentration normal   Assessment & Plan:  Annual visit for general adult medical examination with abnormal findings Annual exam as documented. Counseling done  re healthy lifestyle involving commitment to 150 minutes exercise per week, heart healthy diet, and attaining healthy weight.The importance of adequate sleep also discussed. Regular seat belt use and home safety, is also discussed. Changes in health habits are decided on by the patient with goals and time frames  set for achieving them. Immunization and cancer screening needs are specifically addressed at this visit.   NICOTINE ADDICTION Asked:confirms currently smokes cigarettes 10/day Assess: Unwilling to set a quit date, but is cutting back Advise: needs to QUIT to reduce risk of cancer, cardio and cerebrovascular disease Assist: counseled for 5 minutes and literature provided Arrange: follow up in 2 to 4 months   Encounter for screening for lung cancer Referrla made , past due  Pap smear for cervical cancer screening Specimen sent  Possible exposure to STD Hiv screen sent  Hyperlipidemia Hyperlipidemia:Low fat diet discussed and encouraged.   Lipid Panel  Lab Results  Component Value Date   CHOL 222 (H) 09/27/2023   HDL 51 09/27/2023   LDLCALC 138 (H) 09/27/2023   TRIG 183 (H) 09/27/2023   CHOLHDL 4.4 09/27/2023     Needs to start medication  Overweight (BMI 25.0-29.9)  Patient re-educated about  the importance of commitment to a  minimum of 150 minutes of exercise per week as able.  The importance of healthy food choices  with portion control discussed, as well as eating regularly and within a 12 hour window most days. The need to choose clean , green food 50 to 75% of the time is discussed, as well as to make water the primary drink and set a goal of 64 ounces water daily.       09/27/2023    2:01 PM 04/06/2023    9:48 AM 06/01/2022    8:19 AM  Weight /BMI  Weight 178 lb 180 lb 1.3 oz 184 lb   Height 5' 7 (1.702 m) 5' 7 (1.702 m) 5' 7 (1.702 m)  BMI 27.88 kg/m2 28.2 kg/m2 28.82 kg/m2

## 2023-09-27 NOTE — Assessment & Plan Note (Signed)

## 2023-09-27 NOTE — Assessment & Plan Note (Signed)
Specimen sent.

## 2023-09-27 NOTE — Assessment & Plan Note (Signed)
 Referrla made , past due

## 2023-09-28 ENCOUNTER — Ambulatory Visit: Payer: Self-pay | Admitting: Family Medicine

## 2023-09-28 ENCOUNTER — Other Ambulatory Visit: Payer: Self-pay

## 2023-09-28 DIAGNOSIS — E785 Hyperlipidemia, unspecified: Secondary | ICD-10-CM

## 2023-09-28 DIAGNOSIS — Z01419 Encounter for gynecological examination (general) (routine) without abnormal findings: Secondary | ICD-10-CM | POA: Insufficient documentation

## 2023-09-28 LAB — LIPID PANEL
Chol/HDL Ratio: 4.4 ratio (ref 0.0–4.4)
Cholesterol, Total: 222 mg/dL — ABNORMAL HIGH (ref 100–199)
HDL: 51 mg/dL (ref >39)
LDL Chol Calc (NIH): 138 mg/dL — ABNORMAL HIGH (ref 0–99)
Triglycerides: 183 mg/dL — ABNORMAL HIGH (ref 0–149)
VLDL Cholesterol Cal: 33 mg/dL (ref 5–40)

## 2023-09-28 LAB — HEPATIC FUNCTION PANEL
ALT: 33 IU/L — ABNORMAL HIGH (ref 0–32)
AST: 31 IU/L (ref 0–40)
Albumin: 4.3 g/dL (ref 3.9–4.9)
Alkaline Phosphatase: 82 IU/L (ref 44–121)
Bilirubin Total: 0.5 mg/dL (ref 0.0–1.2)
Bilirubin, Direct: 0.21 mg/dL (ref 0.00–0.40)
Total Protein: 7 g/dL (ref 6.0–8.5)

## 2023-09-28 LAB — HIV ANTIBODY (ROUTINE TESTING W REFLEX): HIV Screen 4th Generation wRfx: NONREACTIVE

## 2023-09-28 MED ORDER — ROSUVASTATIN CALCIUM 5 MG PO TABS: 5.0000 mg | ORAL_TABLET | Freq: Every day | ORAL | 2 refills | Status: AC

## 2023-09-28 NOTE — Assessment & Plan Note (Signed)
  Patient re-educated about  the importance of commitment to a  minimum of 150 minutes of exercise per week as able.  The importance of healthy food choices with portion control discussed, as well as eating regularly and within a 12 hour window most days. The need to choose clean , green food 50 to 75% of the time is discussed, as well as to make water the primary drink and set a goal of 64 ounces water daily.       09/27/2023    2:01 PM 04/06/2023    9:48 AM 06/01/2022    8:19 AM  Weight /BMI  Weight 178 lb 180 lb 1.3 oz 184 lb  Height 5' 7 (1.702 m) 5' 7 (1.702 m) 5' 7 (1.702 m)  BMI 27.88 kg/m2 28.2 kg/m2 28.82 kg/m2

## 2023-09-28 NOTE — Assessment & Plan Note (Signed)
 Hyperlipidemia:Low fat diet discussed and encouraged.   Lipid Panel  Lab Results  Component Value Date   CHOL 222 (H) 09/27/2023   HDL 51 09/27/2023   LDLCALC 138 (H) 09/27/2023   TRIG 183 (H) 09/27/2023   CHOLHDL 4.4 09/27/2023     Needs to start medication

## 2023-09-28 NOTE — Addendum Note (Signed)
 Addended by: ANTONETTA QUANT E on: 09/28/2023 07:09 AM   Modules accepted: Level of Service

## 2023-10-01 LAB — CERVICOVAGINAL ANCILLARY ONLY
Bacterial Vaginitis (gardnerella): NEGATIVE
Candida Glabrata: NEGATIVE
Candida Vaginitis: NEGATIVE
Comment: NEGATIVE
Comment: NEGATIVE
Comment: NEGATIVE

## 2023-10-03 LAB — CYTOLOGY - PAP
Chlamydia: NEGATIVE
Comment: NEGATIVE
Comment: NEGATIVE
Comment: NEGATIVE
Comment: NORMAL
Diagnosis: NEGATIVE
High risk HPV: NEGATIVE
Neisseria Gonorrhea: NEGATIVE
Trichomonas: NEGATIVE

## 2023-10-09 ENCOUNTER — Telehealth: Payer: Self-pay

## 2023-10-09 DIAGNOSIS — Z122 Encounter for screening for malignant neoplasm of respiratory organs: Secondary | ICD-10-CM

## 2023-10-09 DIAGNOSIS — Z87891 Personal history of nicotine dependence: Secondary | ICD-10-CM

## 2023-10-09 DIAGNOSIS — F17210 Nicotine dependence, cigarettes, uncomplicated: Secondary | ICD-10-CM

## 2023-10-09 NOTE — Telephone Encounter (Signed)
 Lung Cancer Screening Narrative/Criteria Questionnaire (Cigarette Smokers Only- No Cigars/Pipes/vapes)   Laura Guerrero   SDMV:10/16/2023 at 2:00 pm with [name]        Nov 10, 1960               LDCT: 10/25/2023 at 7:30 am AP    62 y.o.   Phone: [phone]  Lung Screening Narrative (confirm age 62-77 yrs Medicare / 50-80 yrs Private pay insurance)   Insurance information:[employer]   Referring Provider:Simpson, MD   This screening involves an initial phone call with a team member from our program. It is called a shared decision making visit. The initial meeting is required by  insurance and Medicare to make sure you understand the program. This appointment takes about 15-20 minutes to complete. You will complete the screening scan at your scheduled date/time.  This scan takes about 5-10 minutes to complete. You can eat and drink normally before and after the scan.  Criteria questions for Lung Cancer Screening:   Are you a current or former smoker? Current Age began smoking: 13   If you are a former smoker, what year did you quit smoking? Never quit more than one year (within 15 yrs)   To calculate your smoking history, I need an accurate estimate of how many packs of cigarettes you smoked per day and for how many years. (Not just the number of PPD you are now smoking)   Years smoking 49 x Packs per day .5 = Pack years 24.5   (at least 20 pack yrs)   (Make sure they understand that we need to know how much they have smoked in the past, not just the number of PPD they are smoking now)  Do you have a personal history of cancer?  No    Do you have a family history of cancer? No  Are you coughing up blood?  No  Have you had unexplained weight loss of 15 lbs or more in the last 6 months? No  It looks like you meet all criteria.  When would be a good time for us  to schedule you for this screening?   Additional information: N/A

## 2023-10-09 NOTE — Telephone Encounter (Signed)
 Copied from CRM [id number]. Topic: Clinical - Medical Advice >> Oct 09, 2023  8:47 AM Carlatta H wrote: Reason for CRM: The patient is requesting a call back from Nurse Adelina

## 2023-10-10 NOTE — Telephone Encounter (Signed)
 Lvm to cb

## 2023-10-16 ENCOUNTER — Encounter: Payer: Self-pay | Admitting: *Deleted

## 2023-10-16 ENCOUNTER — Ambulatory Visit: Admitting: *Deleted

## 2023-10-16 DIAGNOSIS — F17210 Nicotine dependence, cigarettes, uncomplicated: Secondary | ICD-10-CM | POA: Diagnosis not present

## 2023-10-16 NOTE — Patient Instructions (Signed)

## 2023-10-16 NOTE — Progress Notes (Signed)
 Virtual Visit via Telephone Note  I connected with Laura Guerrero on 10/16/23 at  1:00 PM EDT by telephone and verified that I am speaking with the correct person using two identifiers.  Location: Patient: in home Provider: [address] [place], [place], [address]    Shared Decision Making Visit Lung Cancer Screening Program [phone])   Eligibility: Age 63 y.o. Pack Years Smoking History Calculation 24.5 (# packs/per year x # years smoked) Recent History of coughing up blood  no Unexplained weight loss? no ( >Than 15 pounds within the last 6 months ) Prior History Lung / other cancer no (Diagnosis within the last 5 years already requiring surveillance chest CT Scans). Smoking Status Current Smoker Former Smokers: Years since quit: NA  Quit Date: NA  Visit Components: Discussion included one or more decision making aids. yes Discussion included risk/benefits of screening. yes Discussion included potential follow up diagnostic testing for abnormal scans. yes Discussion included meaning and risk of over diagnosis. yes Discussion included meaning and risk of False Positives. yes Discussion included meaning of total radiation exposure. yes  Counseling Included: Importance of adherence to annual lung cancer LDCT screening. yes Impact of comorbidities on ability to participate in the program. yes Ability and willingness to under diagnostic treatment. yes  Smoking Cessation Counseling: Current Smokers:  Discussed importance of smoking cessation. yes Information about tobacco cessation classes and interventions provided to patient. yes Patient provided with ticket for LDCT Scan. yes Symptomatic Patient. no  CounselingNA Diagnosis Code: Tobacco Use Z72.0 Asymptomatic Patient yes  Counseling (Intermediate counseling: > three minutes counseling) [id number] Former Smokers:  Discussed the importance of maintaining cigarette abstinence. yes Diagnosis Code: Personal History of  Nicotine Dependence. S12.108 Information about tobacco cessation classes and interventions provided to patient. Yes Patient provided with ticket for LDCT Scan. yes Written Order for Lung Cancer Screening with LDCT placed in Epic. Yes (CT Chest Lung Cancer Screening Low Dose W/O CM) [id number] Z12.2-Screening of respiratory organs Z87.891-Personal history of nicotine dependence   [name] Ranger, RN 10/16/23

## 2023-10-25 ENCOUNTER — Ambulatory Visit (HOSPITAL_COMMUNITY)

## 2023-10-29 ENCOUNTER — Ambulatory Visit (HOSPITAL_COMMUNITY)
Admission: RE | Admit: 2023-10-29 | Discharge: 2023-10-29 | Disposition: A | Discharge status: Home / Self Care | Source: Ambulatory Visit | Attending: Acute Care | Admitting: Acute Care

## 2023-10-29 DIAGNOSIS — F17210 Nicotine dependence, cigarettes, uncomplicated: Secondary | ICD-10-CM | POA: Insufficient documentation

## 2023-10-29 DIAGNOSIS — Z122 Encounter for screening for malignant neoplasm of respiratory organs: Secondary | ICD-10-CM | POA: Insufficient documentation

## 2023-10-29 DIAGNOSIS — Z87891 Personal history of nicotine dependence: Secondary | ICD-10-CM | POA: Insufficient documentation

## 2023-11-13 ENCOUNTER — Other Ambulatory Visit: Payer: Self-pay | Admitting: Acute Care

## 2023-11-13 DIAGNOSIS — Z122 Encounter for screening for malignant neoplasm of respiratory organs: Secondary | ICD-10-CM

## 2023-11-13 DIAGNOSIS — Z87891 Personal history of nicotine dependence: Secondary | ICD-10-CM

## 2023-11-13 DIAGNOSIS — F17210 Nicotine dependence, cigarettes, uncomplicated: Secondary | ICD-10-CM

## 2023-11-14 ENCOUNTER — Ambulatory Visit (HOSPITAL_COMMUNITY)
Admission: RE | Admit: 2023-11-14 | Discharge: 2023-11-14 | Disposition: A | Discharge status: Home / Self Care | Payer: 59 | Source: Ambulatory Visit | Attending: Family Medicine | Admitting: Family Medicine

## 2023-11-14 DIAGNOSIS — Z1231 Encounter for screening mammogram for malignant neoplasm of breast: Secondary | ICD-10-CM | POA: Diagnosis present

## 2023-12-05 NOTE — Progress Notes (Signed)
 Counseled patient 4 minutes regarding tobacco use.

## 2023-12-28 ENCOUNTER — Ambulatory Visit: Payer: Self-pay

## 2024-04-08 ENCOUNTER — Encounter: Payer: 59 | Admitting: Family Medicine

## 2024-06-05 ENCOUNTER — Encounter: Payer: Self-pay | Admitting: Family Medicine
